# Patient Record
Sex: Male | Born: 1985 | Race: White | Hispanic: No | Marital: Single | Smoking: Current every day smoker
Health system: Southern US, Community
[De-identification: ages and names within clinical notes are randomized; demographics above are authoritative.]

## PROBLEM LIST (undated history)

## (undated) DIAGNOSIS — L03211 Cellulitis of face: Secondary | ICD-10-CM

## (undated) DIAGNOSIS — M545 Low back pain, unspecified: Secondary | ICD-10-CM

## (undated) DIAGNOSIS — F32A Depression, unspecified: Secondary | ICD-10-CM

## (undated) DIAGNOSIS — F329 Major depressive disorder, single episode, unspecified: Secondary | ICD-10-CM

## (undated) DIAGNOSIS — K047 Periapical abscess without sinus: Secondary | ICD-10-CM

## (undated) HISTORY — DX: Low back pain, unspecified: M54.50

## (undated) HISTORY — DX: Cellulitis of face: L03.211

## (undated) HISTORY — DX: Periapical abscess without sinus: K04.7

---
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
## 2009-04-12 ENCOUNTER — Inpatient Hospital Stay (HOSPITAL_COMMUNITY): Admission: EM | Admit: 2009-04-12 | Discharge: 2009-04-15 | Payer: Self-pay | Admitting: Emergency Medicine

## 2009-04-12 ENCOUNTER — Ambulatory Visit: Payer: Self-pay | Admitting: Internal Medicine

## 2009-04-15 ENCOUNTER — Ambulatory Visit: Payer: Self-pay | Admitting: Dentistry

## 2009-04-22 ENCOUNTER — Encounter: Admission: AD | Admit: 2009-04-22 | Discharge: 2009-04-22 | Payer: Self-pay | Admitting: Dentistry

## 2009-05-01 ENCOUNTER — Ambulatory Visit: Payer: Self-pay | Admitting: Internal Medicine

## 2009-05-01 DIAGNOSIS — L0201 Cutaneous abscess of face: Secondary | ICD-10-CM

## 2009-05-01 DIAGNOSIS — L03211 Cellulitis of face: Secondary | ICD-10-CM

## 2009-05-01 DIAGNOSIS — H538 Other visual disturbances: Secondary | ICD-10-CM

## 2009-05-08 ENCOUNTER — Telehealth (INDEPENDENT_AMBULATORY_CARE_PROVIDER_SITE_OTHER): Payer: Self-pay | Admitting: Internal Medicine

## 2009-05-17 ENCOUNTER — Telehealth: Payer: Self-pay | Admitting: *Deleted

## 2009-05-21 ENCOUNTER — Ambulatory Visit: Payer: Self-pay | Admitting: Infectious Disease

## 2009-05-21 DIAGNOSIS — K089 Disorder of teeth and supporting structures, unspecified: Secondary | ICD-10-CM | POA: Insufficient documentation

## 2009-05-30 ENCOUNTER — Ambulatory Visit (HOSPITAL_COMMUNITY): Admission: RE | Admit: 2009-05-30 | Discharge: 2009-05-30 | Payer: Self-pay | Admitting: Dentistry

## 2009-09-10 ENCOUNTER — Emergency Department (HOSPITAL_COMMUNITY): Admission: EM | Admit: 2009-09-10 | Discharge: 2009-09-10 | Payer: Self-pay | Admitting: Emergency Medicine

## 2010-10-13 ENCOUNTER — Encounter: Payer: Self-pay | Admitting: Internal Medicine

## 2010-10-24 LAB — BASIC METABOLIC PANEL
BUN: 4 mg/dL — ABNORMAL LOW (ref 6–23)
CO2: 27 mEq/L (ref 19–32)
Calcium: 8.9 mg/dL (ref 8.4–10.5)
Chloride: 103 mEq/L (ref 96–112)
GFR calc Af Amer: >60 mL/min (ref >60)
GFR calc non Af Amer: >60 mL/min (ref >60)
GFR calc non Af Amer: >60 mL/min (ref >60)
GFR calc non Af Amer: >60 mL/min (ref >60)
Glucose, Bld: 82 mg/dL (ref 70–99)
Glucose, Bld: 94 mg/dL (ref 70–99)
Potassium: 3.6 mEq/L (ref 3.5–5.1)
Potassium: 3.8 mEq/L (ref 3.5–5.1)
Sodium: 139 mEq/L (ref 135–145)
Sodium: 141 mEq/L (ref 135–145)

## 2010-10-24 LAB — CBC
HCT: 39 % (ref 39.0–52.0)
HCT: 42.1 % (ref 39.0–52.0)
HCT: 42.8 % (ref 39.0–52.0)
Hemoglobin: 13.2 g/dL (ref 13.0–17.0)
Hemoglobin: 13.5 g/dL (ref 13.0–17.0)
Hemoglobin: 14.6 g/dL (ref 13.0–17.0)
MCHC: 34.7 g/dL (ref 30.0–36.0)
MCV: 89.3 fL (ref 78.0–100.0)
MCV: 90 fL (ref 78.0–100.0)
Platelets: 219 10*3/uL (ref 150–400)
RBC: 4.3 MIL/uL (ref 4.22–5.81)
RBC: 4.79 MIL/uL (ref 4.22–5.81)
RDW: 13.2 % (ref 11.5–15.5)
RDW: 13.3 % (ref 11.5–15.5)
WBC: 10 10*3/uL (ref 4.0–10.5)

## 2010-10-24 LAB — POCT I-STAT, CHEM 8
Calcium, Ion: 1.16 mmol/L (ref 1.12–1.32)
Creatinine, Ser: 1 mg/dL (ref 0.4–1.5)
Glucose, Bld: 120 mg/dL — ABNORMAL HIGH (ref 70–99)
Hemoglobin: 15.6 g/dL (ref 13.0–17.0)
Potassium: 3.7 mEq/L (ref 3.5–5.1)

## 2010-10-24 LAB — DIFFERENTIAL
Eosinophils Absolute: 0 10*3/uL (ref 0.0–0.7)
Lymphs Abs: 1 10*3/uL (ref 0.7–4.0)
Monocytes Absolute: 0.7 10*3/uL (ref 0.1–1.0)
Monocytes Relative: 7 % (ref 3–12)
Neutrophils Relative %: 83 % — ABNORMAL HIGH (ref 43–77)

## 2010-10-24 LAB — CULTURE, BLOOD (ROUTINE X 2): Culture: NO GROWTH

## 2012-03-04 ENCOUNTER — Encounter (HOSPITAL_COMMUNITY): Payer: Self-pay | Admitting: Family Medicine

## 2012-03-04 ENCOUNTER — Emergency Department (HOSPITAL_COMMUNITY)
Admission: EM | Admit: 2012-03-04 | Discharge: 2012-03-04 | Disposition: A | Discharge status: Home / Self Care | Payer: Self-pay | Attending: Emergency Medicine | Admitting: Emergency Medicine

## 2012-03-04 DIAGNOSIS — R4689 Other symptoms and signs involving appearance and behavior: Secondary | ICD-10-CM

## 2012-03-04 DIAGNOSIS — F911 Conduct disorder, childhood-onset type: Secondary | ICD-10-CM | POA: Insufficient documentation

## 2012-03-04 HISTORY — DX: Major depressive disorder, single episode, unspecified: F32.9

## 2012-03-04 HISTORY — DX: Depression, unspecified: F32.A

## 2012-03-04 LAB — CBC WITH DIFFERENTIAL/PLATELET
Basophils Relative: 1 % (ref 0–1)
Eosinophils Absolute: 0.1 10*3/uL (ref 0.0–0.7)
Eosinophils Relative: 1 % (ref 0–5)
Hemoglobin: 14.5 g/dL (ref 13.0–17.0)
MCH: 30.9 pg (ref 26.0–34.0)
MCHC: 35.5 g/dL (ref 30.0–36.0)
MCV: 87 fL (ref 78.0–100.0)
Monocytes Absolute: 0.5 10*3/uL (ref 0.1–1.0)
Monocytes Relative: 5 % (ref 3–12)
Neutrophils Relative %: 82 % — ABNORMAL HIGH (ref 43–77)

## 2012-03-04 LAB — BASIC METABOLIC PANEL
BUN: 9 mg/dL (ref 6–23)
Calcium: 9.1 mg/dL (ref 8.4–10.5)
Creatinine, Ser: 1.06 mg/dL (ref 0.50–1.35)
GFR calc Af Amer: >90 mL/min (ref >90)
GFR calc non Af Amer: >90 mL/min (ref >90)
Potassium: 3.9 mEq/L (ref 3.5–5.1)

## 2012-03-04 LAB — RAPID URINE DRUG SCREEN, HOSP PERFORMED: Barbiturates: NOT DETECTED

## 2012-03-04 MED ORDER — ALUM & MAG HYDROXIDE-SIMETH 200-200-20 MG/5ML PO SUSP: 30.0000 mL | ORAL | Status: DC | PRN

## 2012-03-04 MED ORDER — ONDANSETRON HCL 4 MG PO TABS: 4.0000 mg | ORAL_TABLET | Freq: Three times a day (TID) | ORAL | Status: DC | PRN

## 2012-03-04 MED ORDER — IBUPROFEN 600 MG PO TABS: 600.0000 mg | ORAL_TABLET | Freq: Three times a day (TID) | ORAL | Status: DC | PRN

## 2012-03-04 MED ORDER — ZOLPIDEM TARTRATE 5 MG PO TABS: 5.0000 mg | ORAL_TABLET | Freq: Every evening | ORAL | Status: DC | PRN

## 2012-03-04 MED ORDER — NICOTINE 21 MG/24HR TD PT24: 21.0000 mg | MEDICATED_PATCH | Freq: Every day | TRANSDERMAL | Status: DC
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
## 2012-03-04 NOTE — ED Notes (Signed)
Pt and belongings wanded by security. 2 pt belonging bags located behind nurses station at triage.
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
## 2012-03-04 NOTE — ED Notes (Signed)
Pt unable to provide urine sample at this time 

## 2012-03-04 NOTE — ED Notes (Signed)
Security called to wand pt  

## 2012-03-04 NOTE — ED Notes (Signed)
Pt in bathroom changing into blue paper scrubs.

## 2012-03-05 NOTE — ED Provider Notes (Signed)
Medical screening examination/treatment/procedure(s) were performed by non-physician practitioner and as supervising physician I was immediately available for consultation/collaboration.   Dwon Sky, MD 03/05/12 0019 

## 2013-05-21 ENCOUNTER — Emergency Department (HOSPITAL_COMMUNITY)
Admission: EM | Admit: 2013-05-21 | Discharge: 2013-05-21 | Disposition: A | Discharge status: Home / Self Care | Payer: Self-pay | Attending: Emergency Medicine | Admitting: Emergency Medicine

## 2013-05-21 ENCOUNTER — Encounter (HOSPITAL_COMMUNITY): Payer: Self-pay | Admitting: Emergency Medicine

## 2013-05-21 DIAGNOSIS — K0889 Other specified disorders of teeth and supporting structures: Secondary | ICD-10-CM

## 2013-05-21 DIAGNOSIS — F17200 Nicotine dependence, unspecified, uncomplicated: Secondary | ICD-10-CM | POA: Insufficient documentation

## 2013-05-21 DIAGNOSIS — K089 Disorder of teeth and supporting structures, unspecified: Secondary | ICD-10-CM | POA: Insufficient documentation

## 2013-05-21 DIAGNOSIS — F329 Major depressive disorder, single episode, unspecified: Secondary | ICD-10-CM | POA: Insufficient documentation

## 2013-05-21 DIAGNOSIS — K029 Dental caries, unspecified: Secondary | ICD-10-CM | POA: Insufficient documentation

## 2013-05-21 DIAGNOSIS — Z79899 Other long term (current) drug therapy: Secondary | ICD-10-CM | POA: Insufficient documentation

## 2013-05-21 DIAGNOSIS — F3289 Other specified depressive episodes: Secondary | ICD-10-CM | POA: Insufficient documentation

## 2013-05-21 MED ORDER — PENICILLIN V POTASSIUM 500 MG PO TABS: 500.0000 mg | ORAL_TABLET | Freq: Four times a day (QID) | ORAL | Status: DC

## 2013-05-21 MED ORDER — PENICILLIN V POTASSIUM 250 MG PO TABS
500.0000 mg | ORAL_TABLET | Freq: Once | ORAL | Status: AC
  Administered 2013-05-21: 500 mg via ORAL
  Filled 2013-05-21: qty 2

## 2013-05-21 MED ORDER — HYDROCODONE-ACETAMINOPHEN 5-325 MG PO TABS: 2.0000 | ORAL_TABLET | Freq: Four times a day (QID) | ORAL | Status: DC | PRN

## 2013-05-21 NOTE — ED Provider Notes (Signed)
Medical screening examination/treatment/procedure(s) were performed by non-physician practitioner and as supervising physician I was immediately available for consultation/collaboration.   Oliviah Agostini, MD 05/21/13 0712 

## 2013-05-21 NOTE — ED Notes (Signed)
Pt states he started getting facial swelling yesterday afternoon. Pt states that a year ago he was supposed to have several teeth removed but the operation was not done because he had coffee that morning.

## 2013-05-21 NOTE — ED Provider Notes (Signed)
CSN: [id number]     Arrival date & time 05/21/13  0421 History   First MD Initiated Contact with Patient 05/21/13 [phone]     Chief Complaint  Patient presents with  . Facial Swelling   (Consider location/radiation/quality/duration/timing/severity/associated sxs/prior Treatment) HPI Comments: Patient presents to the ED with a chief complaint of dental pain/mouth swelling. Patient states the symptoms began yesterday. He states that he was supposed to have his teeth pulled approximately a year ago, but the operation was not done at that time. He reports moderate pain. He is tried taking ibuprofen from with some relief. He denies fevers, chills, nausea, or vomiting. He states that he does not have a dentist, but would like resources for one. Nothing makes his symptoms better or worse.  The history is provided by the patient. No language interpreter was used.    Past Medical History  Diagnosis Date  . Facial cellulitis     2/2 dental abscess (tooth #7)  . Dental abscess   . Depression    History reviewed. No pertinent past surgical history. History reviewed. No pertinent family history. History  Substance Use Topics  . Smoking status: Current Every Day Smoker -- 1.00 packs/day    Types: Cigarettes  . Smokeless tobacco: Never Used  . Alcohol Use: Yes     Comment: Occasional    Review of Systems  All other systems reviewed and are negative.    Allergies  Review of patient's allergies indicates no known allergies.  Home Medications   Current Outpatient Rx  Name  Route  Sig  Dispense  Refill  . Aspirin-Acetaminophen-Caffeine (GOODY HEADACHE PO)   Oral   Take 1 Package by mouth every 6 (six) hours as needed (pain).         Marland Kitchen ibuprofen (ADVIL,MOTRIN) 200 MG tablet   Oral   Take 800 mg by mouth every 6 (six) hours as needed for pain.         Marland Kitchen PRESCRIPTION MEDICATION   Oral   Take 1 tablet by mouth. This medication is a leftover antibiotic.  He took a total of 4 pills.  He  does not know the name or strength of the medication.          BP 125/74  Pulse 108  Temp(Src) 96.9 F (36.1 C) (Oral)  Resp 18  SpO2 98% Physical Exam  Nursing note and vitals reviewed. Constitutional: He is oriented to person, place, and time. He appears well-developed and well-nourished.  HENT:  Head: Normocephalic and atraumatic.  Mouth/Throat:    Poor dentition throughout.  Affected tooth as diagrammed.  No signs of peritonsillar or tonsillar abscess.  No signs of gingival abscess. Oropharynx is clear and without exudates.  Uvula is midline.  Airway is intact. No signs of Ludwig's angina with palpation of oral and sublingual mucosa.   Eyes: Conjunctivae and EOM are normal.  Neck: Normal range of motion.  Cardiovascular: Normal rate.   Pulmonary/Chest: Effort normal.  Abdominal: He exhibits no distension.  Musculoskeletal: Normal range of motion.  Neurological: He is alert and oriented to person, place, and time.  Skin: Skin is dry.  Psychiatric: He has a normal mood and affect. His behavior is normal. Judgment and thought content normal.    ED Course  Procedures (including critical care time) Labs Review Labs Reviewed - No data to display Imaging Review No results found.  EKG Interpretation   None       MDM   1. Pain, dental  Patient with dental pain and oral swelling, no signs of peritonsillar or tonsillar abscess, no signs of Ludwig angina. No trismus. Will treat with penicillin and pain medicine, recommend dental followup. Patient advised that his teeth are not going to get better without seeing a dentist. Patient understands and agrees to plan. He is stable and ready for discharge.  Filed Vitals:   05/21/13 0632  BP: 123/74  Pulse: 91  Temp: 98.9 F (37.2 C)  Resp: [address] [place] 05/21/13 [phone]

## 2014-06-09 ENCOUNTER — Emergency Department (HOSPITAL_COMMUNITY)
Admission: EM | Admit: 2014-06-09 | Discharge: 2014-06-09 | Disposition: A | Discharge status: Home / Self Care | Payer: Self-pay | Attending: Emergency Medicine | Admitting: Emergency Medicine

## 2014-06-09 ENCOUNTER — Encounter (HOSPITAL_COMMUNITY): Payer: Self-pay | Admitting: Emergency Medicine

## 2014-06-09 ENCOUNTER — Emergency Department (HOSPITAL_COMMUNITY): Payer: Self-pay

## 2014-06-09 DIAGNOSIS — Z720 Tobacco use: Secondary | ICD-10-CM | POA: Insufficient documentation

## 2014-06-09 DIAGNOSIS — Z8659 Personal history of other mental and behavioral disorders: Secondary | ICD-10-CM | POA: Insufficient documentation

## 2014-06-09 DIAGNOSIS — Z872 Personal history of diseases of the skin and subcutaneous tissue: Secondary | ICD-10-CM | POA: Insufficient documentation

## 2014-06-09 DIAGNOSIS — K047 Periapical abscess without sinus: Secondary | ICD-10-CM | POA: Insufficient documentation

## 2014-06-09 DIAGNOSIS — K029 Dental caries, unspecified: Secondary | ICD-10-CM | POA: Insufficient documentation

## 2014-06-09 LAB — CBC WITH DIFFERENTIAL/PLATELET
Basophils Absolute: 0 10*3/uL (ref 0.0–0.1)
Basophils Relative: 0 % (ref 0–1)
Eosinophils Absolute: 0 10*3/uL (ref 0.0–0.7)
Eosinophils Relative: 0 % (ref 0–5)
HEMATOCRIT: 41.7 % (ref 39.0–52.0)
HEMOGLOBIN: 14.7 g/dL (ref 13.0–17.0)
LYMPHS PCT: 11 % — AB (ref 12–46)
Lymphs Abs: 1.3 10*3/uL (ref 0.7–4.0)
MCH: 31 pg (ref 26.0–34.0)
MCHC: 35.3 g/dL (ref 30.0–36.0)
MCV: 88 fL (ref 78.0–100.0)
MONO ABS: 1.3 10*3/uL — AB (ref 0.1–1.0)
MONOS PCT: 11 % (ref 3–12)
NEUTROS ABS: 9.1 10*3/uL — AB (ref 1.7–7.7)
Neutrophils Relative %: 78 % — ABNORMAL HIGH (ref 43–77)
Platelets: 177 10*3/uL (ref 150–400)
RBC: 4.74 MIL/uL (ref 4.22–5.81)
RDW: 12.8 % (ref 11.5–15.5)
WBC: 11.7 10*3/uL — AB (ref 4.0–10.5)

## 2014-06-09 LAB — I-STAT CHEM 8, ED
BUN: 5 mg/dL — AB (ref 6–23)
CALCIUM ION: 1.06 mmol/L — AB (ref 1.12–1.23)
CHLORIDE: 102 meq/L (ref 96–112)
CREATININE: 0.9 mg/dL (ref 0.50–1.35)
GLUCOSE: 94 mg/dL (ref 70–99)
HCT: 46 % (ref 39.0–52.0)
Hemoglobin: 15.6 g/dL (ref 13.0–17.0)
Potassium: 3.8 mEq/L (ref 3.7–5.3)
Sodium: 138 mEq/L (ref 137–147)
TCO2: 29 mmol/L (ref 0–100)

## 2014-06-09 LAB — BASIC METABOLIC PANEL
ANION GAP: 13 (ref 5–15)
BUN: 6 mg/dL (ref 6–23)
CHLORIDE: 104 meq/L (ref 96–112)
CO2: 29 meq/L (ref 19–32)
CREATININE: 0.84 mg/dL (ref 0.50–1.35)
Calcium: 9.5 mg/dL (ref 8.4–10.5)
GFR calc Af Amer: >90 mL/min (ref >90)
GFR calc non Af Amer: >90 mL/min (ref >90)
Glucose, Bld: 100 mg/dL — ABNORMAL HIGH (ref 70–99)
Potassium: 4.9 mEq/L (ref 3.7–5.3)
SODIUM: 146 meq/L (ref 137–147)

## 2014-06-09 MED ORDER — HYDROMORPHONE HCL 1 MG/ML IJ SOLN
1.0000 mg | Freq: Once | INTRAMUSCULAR | Status: AC
Start: 2014-06-09 — End: 2014-06-09
  Administered 2014-06-09: 1 mg via INTRAVENOUS
  Filled 2014-06-09: qty 1

## 2014-06-09 MED ORDER — OXYCODONE-ACETAMINOPHEN 10-325 MG PO TABS: 0.5000 | ORAL_TABLET | ORAL | Status: DC | PRN

## 2014-06-09 MED ORDER — DEXTROSE 5 % IV SOLN
2.5000 10*6.[IU] | Freq: Once | INTRAVENOUS | Status: AC
  Administered 2014-06-09: 2.5 10*6.[IU] via INTRAVENOUS
  Filled 2014-06-09: qty 2.5

## 2014-06-09 MED ORDER — ONDANSETRON HCL 4 MG/2ML IJ SOLN
4.0000 mg | Freq: Once | INTRAMUSCULAR | Status: AC
  Administered 2014-06-09: 4 mg via INTRAVENOUS
  Filled 2014-06-09: qty 2

## 2014-06-09 MED ORDER — PENICILLIN V POTASSIUM 500 MG PO TABS: 500.0000 mg | ORAL_TABLET | Freq: Three times a day (TID) | ORAL | Status: DC

## 2014-06-09 MED ORDER — MORPHINE SULFATE 2 MG/ML IJ SOLN
2.0000 mg | Freq: Once | INTRAMUSCULAR | Status: AC
  Administered 2014-06-09: 2 mg via INTRAVENOUS
  Filled 2014-06-09: qty 1

## 2014-06-09 MED ORDER — IOHEXOL 300 MG/ML  SOLN
100.0000 mL | Freq: Once | INTRAMUSCULAR | Status: AC | PRN
  Administered 2014-06-09: 100 mL via INTRAVENOUS

## 2014-06-09 MED ORDER — SODIUM CHLORIDE 0.9 % IV BOLUS (SEPSIS)
1000.0000 mL | Freq: Once | INTRAVENOUS | Status: AC
  Administered 2014-06-09: 1000 mL via INTRAVENOUS

## 2014-06-09 NOTE — ED Notes (Signed)
Patient transported to CT 

## 2014-06-09 NOTE — ED Provider Notes (Signed)
CSN: [id number]     Arrival date & time 06/09/14  [id number] History   First MD Initiated Contact with Patient 06/09/14 0945     Chief Complaint  Patient presents with  . Dental Pain     (Consider location/radiation/quality/duration/timing/severity/associated sxs/prior Treatment) HPI  Patient is a 28 year old male with a complaint left-sided dental pain. Patient states that he has had about 3 days of swelling on the left side. He states that he felt a pop and some foul tasting fluid drained from the area. He states that it became more swollen and is now extending down the left side of his jaw and neck. He complains of pain with swallowing. He denies any fevers, chills, difficulty breathing. He is a current daily smoker. He has history of previous dental abscess.  Past Medical History  Diagnosis Date  . Facial cellulitis     2/2 dental abscess (tooth #7)  . Dental abscess   . Depression    History reviewed. No pertinent past surgical history. No family history on file. History  Substance Use Topics  . Smoking status: Current Every Day Smoker -- 1.00 packs/day    Types: Cigarettes  . Smokeless tobacco: Never Used  . Alcohol Use: Yes     Comment: Occasional    Review of Systems Ten systems reviewed and are negative for acute change, except as noted in the HPI.     Allergies  Review of patient's allergies indicates no known allergies.  Home Medications   Prior to Admission medications   Medication Sig Start Date End Date Taking? Authorizing Provider  ibuprofen (ADVIL,MOTRIN) 200 MG tablet Take 400 mg by mouth every 6 (six) hours as needed for moderate pain.    Yes Historical Provider, MD   BP 120/67 mmHg  Pulse 98  Temp(Src) 99.1 F (37.3 C) (Oral)  Resp 18  Ht 5\' 9"  (1.753 m)  Wt 148 lb (67.132 kg)  BMI 21.85 kg/m2  SpO2 100% Physical Exam  Constitutional: He appears well-developed and well-nourished. No distress.  HENT:  Head: Normocephalic and atraumatic.     Mouth/Throat:    Poor dentition throughout. Multiple decayed molars on the left jaw. Large amount of swelling and induration on the leftside. Tender to palpation in the left throat. Positive for trismus. Uvula appears midline. No pharyngeal edema. No sub lingual swelling  Eyes: Conjunctivae are normal. No scleral icterus.  Neck: Normal range of motion. Neck supple.  Cardiovascular: Normal rate, regular rhythm and normal heart sounds.   Pulmonary/Chest: Effort normal and breath sounds normal. No respiratory distress.  Abdominal: Soft. There is no tenderness.  Musculoskeletal: He exhibits no edema.  Neurological: He is alert.  Skin: Skin is warm and dry. He is not diaphoretic.  Psychiatric: His behavior is normal.  Nursing note and vitals reviewed.   ED Course  Procedures (including critical care time) Labs Review Labs Reviewed  BASIC METABOLIC PANEL  CBC WITH DIFFERENTIAL  I-STAT CHEM 8, ED    Imaging Review No results found.   EKG Interpretation None      MDM   Final diagnoses:  None    Patient here with left-sided facial swelling and dental infection. He complains of pain and is tender to palpation in the left throat. If ordered labs, IV fluids, IV penicillin, pain medication, and CT of the face. 2. Rule out Ludwig's angina. No stridor. His mildly elevated oral temperature.    Patient with dental abscess. No visualized extension of the abscess into the pharyngeal space.  Patient will be discharge with pain meds and abx. F/u with dentisit  Arthor CaptainAbigail Waylen Depaolo, PA-C 06/17/14 1616  Ethelda ChickMartha K Linker, MD 06/18/14 [phone]

## 2014-06-09 NOTE — Discharge Instructions (Signed)

## 2014-06-09 NOTE — ED Notes (Signed)
Pt from home c/o left sided dental pain and swelling (lower back). See here beginning on Nov for same and was given antibiotics and pain medication but did not follow up with dentist as advised.

## 2015-02-18 ENCOUNTER — Emergency Department (HOSPITAL_COMMUNITY)
Admission: EM | Admit: 2015-02-18 | Discharge: 2015-02-18 | Disposition: A | Discharge status: Home / Self Care | Payer: Self-pay | Attending: Emergency Medicine | Admitting: Emergency Medicine

## 2015-02-18 ENCOUNTER — Encounter (HOSPITAL_COMMUNITY): Payer: Self-pay | Admitting: *Deleted

## 2015-02-18 DIAGNOSIS — K0889 Other specified disorders of teeth and supporting structures: Secondary | ICD-10-CM

## 2015-02-18 DIAGNOSIS — Z720 Tobacco use: Secondary | ICD-10-CM | POA: Insufficient documentation

## 2015-02-18 DIAGNOSIS — K088 Other specified disorders of teeth and supporting structures: Secondary | ICD-10-CM | POA: Insufficient documentation

## 2015-02-18 DIAGNOSIS — Z8659 Personal history of other mental and behavioral disorders: Secondary | ICD-10-CM | POA: Insufficient documentation

## 2015-02-18 DIAGNOSIS — K029 Dental caries, unspecified: Secondary | ICD-10-CM | POA: Insufficient documentation

## 2015-02-18 MED ORDER — HYDROCODONE-ACETAMINOPHEN 5-325 MG PO TABS: 2.0000 | ORAL_TABLET | ORAL | Status: DC | PRN

## 2015-02-18 MED ORDER — CLINDAMYCIN HCL 150 MG PO CAPS: 150.0000 mg | ORAL_CAPSULE | Freq: Four times a day (QID) | ORAL | Status: DC

## 2015-02-18 NOTE — ED Notes (Signed)
The pt is c/o  A toothache with lt jaw swelling since yesterday

## 2015-02-18 NOTE — Discharge Instructions (Signed)
Dental Pain °A tooth ache may be caused by cavities (tooth decay). Cavities expose the nerve of the tooth to air and hot or cold temperatures. It may come from an infection or abscess (also called a boil or furuncle) around your tooth. It is also often caused by dental caries (tooth decay). This causes the pain you are having. °DIAGNOSIS  °Your caregiver can diagnose this problem by exam. °TREATMENT  °· If caused by an infection, it may be treated with medications which kill germs (antibiotics) and pain medications as prescribed by your caregiver. Take medications as directed. °· Only take over-the-counter or prescription medicines for pain, discomfort, or fever as directed by your caregiver. °· Whether the tooth ache today is caused by infection or dental disease, you should see your dentist as soon as possible for further care. °SEEK MEDICAL CARE IF: °The exam and treatment you received today has been provided on an emergency basis only. This is not a substitute for complete medical or dental care. If your problem worsens or new problems (symptoms) appear, and you are unable to meet with your dentist, call or return to this location. °SEEK IMMEDIATE MEDICAL CARE IF:  °· You have a fever. °· You develop redness and swelling of your face, jaw, or neck. °· You are unable to open your mouth. °· You have severe pain uncontrolled by pain medicine. °MAKE SURE YOU:  °· Understand these instructions. °· Will watch your condition. °· Will get help right away if you are not doing well or get worse. °Document Released: 07/06/2005 Document Revised: 09/28/2011 Document Reviewed: 02/22/2008 °ExitCare® Patient Information ©2015 [employer]. This information is not intended to replace advice given to you by your health care provider. Make sure you discuss any questions you have with your health care provider. ° °Emergency Department Resource Guide °1) Find a Doctor and Pay Out of Pocket °Although you won't have to find out who  is covered by your insurance plan, it is a good idea to ask around and get recommendations. You will then need to call the office and see if the doctor you have chosen will accept you as a new patient and what types of options they offer for patients who are self-pay. Some doctors offer discounts or will set up payment plans for their patients who do not have insurance, but you will need to ask so you aren't surprised when you get to your appointment. ° °2) Contact Your Local Health Department °Not all health departments have doctors that can see patients for sick visits, but many do, so it is worth a call to see if yours does. If you don't know where your local health department is, you can check in your phone book. The CDC also has a tool to help you locate your state's health department, and many state websites also have listings of all of their local health departments. ° °3) Find a Walk-in Clinic °If your illness is not likely to be very severe or complicated, you may want to try a walk in clinic. These are popping up all over the country in pharmacies, drugstores, and shopping centers. They're usually staffed by nurse practitioners or physician assistants that have been trained to treat common illnesses and complaints. They're usually fairly quick and inexpensive. However, if you have serious medical issues or chronic medical problems, these are probably not your best option. ° °No Primary Care Doctor: °- Call Health Connect at  832-8000 - they can help you locate a primary   care doctor that  accepts your insurance, provides certain services, etc. °- Physician Referral Service- 1-800-533-3463 ° °Chronic Pain Problems: °Organization         Address  Phone   Notes  °[facility]  ([phone]) 297-2271 Patients need to be referred by their primary care doctor.  ° °Medication Assistance: °Organization         Address  Phone   Notes  °[place] [facility] [address]  [address] °[place], [place] [address] (336) 641-8030 --Must be a resident of [place] °-- Must have NO insurance coverage whatsoever (no Medicaid/ Medicare, etc.) °-- The pt. MUST have a primary care doctor that directs their care regularly and follows them in the community °  °[facility]  (866) 331-1348   °[employer]  (888) 892-1162   ° °Agencies that provide inexpensive medical care: °Organization         Address  Phone   Notes  °[facility]  (336) 832-8035   °[facility]    (336) 832-7272   °[facility] [address] °[place], [place] [address] (336) 832-4777   °[facility] [address], °[place] (336) 271-4999   °[facility]    (336) 373-0678   °[facility]    (336) 272-1050   °[facility] ° [address], [place] Phone:  (336) 832-4444, Fax:  (336) 832-4440 Hours of Operation:  9 am - 6 pm, M-F.  Also accepts Medicaid/Medicare and self-pay.  °[facility] ° [address], [place] Phone: (336) 832-3150, Fax: (336) 832-3151. Hours of Operation:  8:30 am - 5:30 pm, M-F.  Also accepts Medicaid and self-pay.  °[facility] [place] [address], [place] Phone: (336) 878-6027   °[facility] [address], [place], [place] (336)723-1848, Ext. 123 Mondays & Thursdays: 7-9 AM.  First 15 patients are seen on a first come, first serve basis. °  ° °Medicaid-accepting [place] Providers: ° °Organization         Address  Phone   Notes  °[facility] [address], [place] (336) 641-2100 Also accepts self-pay patients.  °[facility] [address], [place] ° (336) 856-9996   °[facility] [address], [place] (336) 288-8857   °[facility] [address] [place] [address], [place] (336) 299-7000   °[facility] [address], [place]  ° (336) 373-1557 Only accepts [place] Access Medicaid patients after they have their name applied to their card.  ° °Self-Pay (no insurance) in [place]: ° °Organization         Address  Phone   Notes  °Sickle Cell Patients, [facility] [address], [place] (336) 832-1970   °[facility] [address], [place] (336) 832-4400   ° [facility] ° [address], [place] (336) 992-4800   °[facility]/Dr. Osei-Bonsu ° [address] [place] [address], [place] or [address], [place] (336) [phone] Phone number for both [place] and [place] locations is the same.  °[facility] [address], [place] (336) 299-0000   °[facility] [address] [place] [address], [place] or [address] (336) 852-7530 °(336) 878-2260   °  [facility] [address], [place] (336) 350-1642, phone; (336) 294-5005, fax Sees patients 1st and 3rd Saturday of every month.  Must not qualify for public or private insurance (i.e. Medicaid, Medicare, [place] Health Choice, Veterans' Benefits) • Household income should be no more than 200% of the poverty level •The clinic cannot treat you if you are pregnant or think you are pregnant • Sexually transmitted diseases are not treated at the clinic.  ° ° °Dental Care: °Organization         Address  Phone  Notes  °[place] [facility] [address], [place] (336) 641-6152 Accepts children up to age 21 who are enrolled in Medicaid or [place] Health Choice; pregnant women with a Medicaid card; and children who have applied for Medicaid or [place] Health Choice, but were declined, whose parents can pay a reduced fee at time of service.  °[facility] [place]  [address], [place] (336) 641-7733 Accepts children up to age 21 who are  enrolled in Medicaid or [place] Health Choice; pregnant women with a Medicaid card; and children who have applied for Medicaid or [place] Health Choice, but were declined, whose parents can pay a reduced fee at time of service.  °[facility] ° [address], [place] (336) 641-4533 Patients are seen by appointment only. Walk-ins are not accepted. [facility] will see patients 18 years of age and older. °Monday - Tuesday (8am-5pm) °Most Wednesdays (8:30-5pm) °$30 per visit, cash only  °[facility] ° [address], [place] (336) 641-4533 Patients are seen by appointment only. Walk-ins are not accepted. [facility] will see patients 18 years of age and older. °One Wednesday Evening (Monthly: Volunteer Based).  $30 per visit, cash only  °[facility]  (919) 537-3737 for adults; Children under age 4, call Graduate Pediatric Dentistry at (919) [phone]. Children aged 4-14, please call ([phone]) [phone] to request a pediatric application. ° Dental services are provided in all areas of dental care including fillings, crowns and bridges, complete and partial dentures, implants, gum treatment, root canals, and extractions. Preventive care is also provided. Treatment is provided to both adults and children. °Patients are selected via a lottery and there is often a waiting list. °  °[facility] [address], ° ° (336) 763-8833 [email or web address].[email or web address] °  °[facility] [address], [place], [place] (336)723-1848, Ext. 123 Second and Fourth Thursday of each month, opens at 6:30 AM; Clinic ends at 9 AM.  Patients are seen on a first-come first-served basis, and a limited number are seen during each clinic.  ° °[facility] ° [address], [place], [place] (336) 723-7904   Eligibility Requirements °You must have lived in [place], [place], or [place] counties for at least the last three months. °  You  cannot be eligible for state or federal sponsored healthcare insurance, including Veterans Administration, Medicaid, or Medicare. °  You generally cannot be eligible for healthcare insurance through your employer.  °  How to apply: °Eligibility screenings are held every Tuesday and Wednesday afternoon from 1:00 pm until 4:00 pm. You do not need an appointment for the interview!  °[facility] [address], [place],  336-631-2330   °[facility]  336-342-8273   °[facility]  336-703-3100   °[facility]   [place] in the Community: Intensive Outpatient Programs Organization         Address  Phone  Notes  [facility] [place] [address] [place], [place] (934) 758-5776   [facility] [address], [place], [place]   [facility] [address] [place], [place]   [place] [address]  [place], [place] or [phone]   Substance Abuse Resources Organization         Address  Phone  Notes  [facility]  724-828-4836   [place]  250-131-6260   [facility]   Chinita Pester  [phone]   Residential & Outpatient Substance Abuse Program  (801)402-8013   Psychological Services Organization         Address  Phone  Notes  [facility] [place]  [place]  (941)746-9411   [place] [address] [place] or 339-836-4699    Mobile Crisis Teams Organization         Address  Phone  Notes  [facility], Mobile Crisis Care Unit  626-147-2728   [facility]  [address] [place], [place]   [place] [address] [place] (412) 818-8695    Self-Help/Support  Groups Organization         Address  Phone             Notes  [facility] [place] - variety of support groups  [place] Call for more information  Narcotics Anonymous (NA), [facility] [address], [employer] [place]  2 meetings at this location   Special educational needs teacher         Address  Phone  Notes  [facility] [place],    [place]  1-628-731-6347   [facility]  [address] [place] [address], [place], [place]   [place], [place] (704)[phone] Admissions: 8am-3pm M-F  Incent[facility] Substance [place] [address]    [place], [place] [phone]   [facility] [address] [place], [place], [place]   [facility] [address]  [place], [place]   [facility] - Intensive Outpatient [address], [place], [place]   [facility] (House.) [place]  [place], [place] 1-956-035-2015 or (929)697-8638   [facility] ([facility]) [address], [place], [place] Accepts Medicaid  [facility] [address],  [place] 1-(209)636-8495 Substance Abuse/Addiction Treatment   [facility]         Address  Phone  Notes  [facility]  641-236-8678   Domenic Schwab, PhD [address] [place], [place]   (475)585-4080 or (412) [phone]   [place], [place] [phone]   [place] [address] [place], [place] [phone] Insurance/Medicaid/sponsorship through [employer] and Families [address], [place]                                    [place], [place] [phone] Therapy/tele-psych/case  [facility] [address]   [place], [place] [phone]    Dr. Lolly Mustache  [phone]   [facility]  [employer] [facility] Dept. 1) [address] [place] 2) [address] [place] 3)  [address], [place] ([phone] [phone]  [phone]   [facility] Child Abuse Hotline 604-255-2712 or (361)[phone] (After Hours)     Please use medication as directed, monitor for new or worsening signs or symptoms. Please follow-up with dentist immediately for further evaluation and management. Please return to the emergency room if new or worsening signs or symptoms present.

## 2015-02-18 NOTE — ED Provider Notes (Signed)
CSN: [id number]     Arrival date & time 02/18/15  0607 History   First MD Initiated Contact with Patient 02/18/15 [phone]     Chief Complaint  Patient presents with  . Dental Problem   HPI   29 year old male presents dental pain and left-sided jaw swelling. Patient reports a significant past medical history of similar presentations, reports that he does not have a dentist is usually seen in the emergency room with antibiotics pain medication. He reports that he is never followed up with a dentist for this as it has improved. Patient reports symptoms started 1 day ago with the dental pain and swelling, patient denies fever, chills, nausea, vomiting, swelling of the throat neck. Patient reports he is able to open and close his jaw, has difficulty with chewing but is able swallow without difficulty. Patient has not tried any over-the-counter medications.   Past Medical History  Diagnosis Date  . Facial cellulitis     2/2 dental abscess (tooth #7)  . Dental abscess   . Depression    History reviewed. No pertinent past surgical history. No family history on file. History  Substance Use Topics  . Smoking status: Current Every Day Smoker -- 1.00 packs/day    Types: Cigarettes  . Smokeless tobacco: Never Used  . Alcohol Use: Yes     Comment: Occasional    Review of Systems  All other systems reviewed and are negative.   Allergies  Review of patient's allergies indicates no known allergies.  Home Medications   Prior to Admission medications   Medication Sig Start Date End Date Taking? Authorizing Provider  clindamycin (CLEOCIN) 150 MG capsule Take 1 capsule (150 mg total) by mouth every 6 (six) hours. 02/18/15   Eyvonne Mechanic, PA-C  HYDROcodone-acetaminophen (NORCO/VICODIN) 5-325 MG per tablet Take 2 tablets by mouth every 4 (four) hours as needed. 02/18/15   Eyvonne Mechanic, PA-C  ibuprofen (ADVIL,MOTRIN) 200 MG tablet Take 400 mg by mouth every 6 (six) hours as needed for moderate pain.      Historical Provider, MD  oxyCODONE-acetaminophen (PERCOCET) 10-325 MG per tablet Take 0.5-1 tablets by mouth every 4 (four) hours as needed for pain. Patient not taking: Reported on 02/18/2015 06/09/14   Arthor Captain, PA-C  penicillin v potassium (VEETID) 500 MG tablet Take 1 tablet (500 mg total) by mouth 3 (three) times daily. Patient not taking: Reported on 02/18/2015 06/09/14   Arthor Captain, PA-C   BP 110/66 mmHg  Pulse 72  Temp(Src) 98.2 F (36.8 C) (Oral)  Resp 12  Ht 5' 9.5" (1.765 m)  SpO2 97%   Physical Exam  Constitutional: He is oriented to person, place, and time. He appears well-developed and well-nourished.  HENT:  Head: Normocephalic and atraumatic.  Face asymmetrical with minor swelling to the left mandible about the second molar. No redness noted to the area of swelling. Patient tender to palpation.   Neck supple with full range of motion nontender to palpation  Patient has multiple missing teeth, dental caries. Second molar with surrounding swelling to the gumline noted no palpable abscess noted. Floor the mouth soft nontender, tongue normal with full range of motion, posterior oropharynx without swelling or edema, remainder of jawline normal.  Eyes: Conjunctivae are normal. Pupils are equal, round, and reactive to light. Right eye exhibits no discharge. Left eye exhibits no discharge. No scleral icterus.  Neck: Normal range of motion. No JVD present. No tracheal deviation present.  Pulmonary/Chest: Effort normal. No stridor.  Neurological: He  is alert and oriented to person, place, and time. Coordination normal.  Psychiatric: He has a normal mood and affect. His behavior is normal. Judgment and thought content normal.  Nursing note and vitals reviewed.   ED Course  Procedures (including critical care time) Labs Review Labs Reviewed - No data to display  Imaging Review No results found.   EKG Interpretation None      MDM   Final diagnoses:  Pain,  dental    Labs:  Imaging:  Consults:  Therapeutics:  Discharge Meds: Clindamycin, Vicodin  Assessment/Plan: Patient presents with likely dental infection. No appreciable abscess on exam that would be amenable to I&D. Symptoms began yesterday, this is likely early in the infectious process, he was prescribed clindamycin, oral pain medication, encouraged to follow-up with dentist for further evaluation and management. He was given dental resources during the ED visit. Neck was supple full range of motion, unlikely deep space infection. Patient was afebrile, vital signs are reassuring. Patient understood and agreed to today's plan and had no further questions or concerns at the time of discharge.         Eyvonne Mechanic, PA-C 02/19/15 1610  Doug Sou, MD 02/19/15 [phone]

## 2015-07-01 ENCOUNTER — Emergency Department (HOSPITAL_COMMUNITY)
Admission: EM | Admit: 2015-07-01 | Discharge: 2015-07-01 | Disposition: A | Discharge status: Home / Self Care | Payer: Self-pay | Attending: Emergency Medicine | Admitting: Emergency Medicine

## 2015-07-01 ENCOUNTER — Encounter (HOSPITAL_COMMUNITY): Payer: Self-pay | Admitting: Emergency Medicine

## 2015-07-01 ENCOUNTER — Emergency Department (HOSPITAL_COMMUNITY): Payer: Self-pay

## 2015-07-01 DIAGNOSIS — F17210 Nicotine dependence, cigarettes, uncomplicated: Secondary | ICD-10-CM | POA: Insufficient documentation

## 2015-07-01 DIAGNOSIS — Z792 Long term (current) use of antibiotics: Secondary | ICD-10-CM | POA: Insufficient documentation

## 2015-07-01 DIAGNOSIS — Z872 Personal history of diseases of the skin and subcutaneous tissue: Secondary | ICD-10-CM | POA: Insufficient documentation

## 2015-07-01 DIAGNOSIS — Z8719 Personal history of other diseases of the digestive system: Secondary | ICD-10-CM | POA: Insufficient documentation

## 2015-07-01 DIAGNOSIS — Z8659 Personal history of other mental and behavioral disorders: Secondary | ICD-10-CM | POA: Insufficient documentation

## 2015-07-01 DIAGNOSIS — M545 Low back pain: Secondary | ICD-10-CM

## 2015-07-01 MED ORDER — NAPROXEN 375 MG PO TABS: 375.0000 mg | ORAL_TABLET | Freq: Two times a day (BID) | ORAL | Status: DC

## 2015-07-01 MED ORDER — CYCLOBENZAPRINE HCL 10 MG PO TABS: 10.0000 mg | ORAL_TABLET | Freq: Two times a day (BID) | ORAL | Status: DC | PRN

## 2015-07-01 MED ORDER — HYDROCODONE-ACETAMINOPHEN 5-325 MG PO TABS
1.0000 | ORAL_TABLET | Freq: Once | ORAL | Status: AC
  Administered 2015-07-01: 1 via ORAL
  Filled 2015-07-01: qty 1

## 2015-07-01 NOTE — ED Notes (Signed)
Pt report lower back pain radiating to left leg x 2 weeks, denies fall nor injury. denies Hx sciatica.. Pt ambulated to room with some difficulty.

## 2015-07-01 NOTE — ED Provider Notes (Signed)
CSN: [id number]     Arrival date & time 07/01/15  1502 History  By signing my name below, I, Jarvis Morganaylor Ferguson, attest that this documentation has been prepared under the direction and in the presence of Dub MikesSamantha Tripp Sladen Plancarte, PA-C Electronically Signed: Jarvis Morganaylor Ferguson, ED Scribe. 07/01/2015. 3:51 PM.     Chief Complaint  Patient presents with  . Back Pain    lower   The history is provided by the patient. No language interpreter was used.    HPI Comments: Renford DillsShawn Domine is a 29 y.o. male who presents to the Emergency Department complaining of sudden onset, intermittent, moderate, back pain that began 2 weeks ago. He states the pain radiates down into his left leg. Pt notes the pain began when he was walking. He denies any h/o sciatica or back problems. Pt endorses the pain is exacerbated with walking or standing for long periods of time. He denies any alleviating factors.  Pt denies any known fall, injury or heavy lifting prior to onset of the pain. He denies any numbness, weakness, fevers, urinary/bowel incontinence, saddle anesthesia or other associated symptoms.    Past Medical History  Diagnosis Date  . Facial cellulitis     2/2 dental abscess (tooth #7)  . Dental abscess   . Depression    History reviewed. No pertinent past surgical history. No family history on file. Social History  Substance Use Topics  . Smoking status: Current Every Day Smoker -- 1.00 packs/day    Types: Cigarettes  . Smokeless tobacco: Never Used  . Alcohol Use: Yes     Comment: Occasional    Review of Systems  All other systems reviewed and are negative.     Allergies  Review of patient's allergies indicates no known allergies.  Home Medications   Prior to Admission medications   Medication Sig Start Date End Date Taking? Authorizing Provider  clindamycin (CLEOCIN) 150 MG capsule Take 1 capsule (150 mg total) by mouth every 6 (six) hours. 02/18/15   Eyvonne MechanicJeffrey Hedges, PA-C   HYDROcodone-acetaminophen (NORCO/VICODIN) 5-325 MG per tablet Take 2 tablets by mouth every 4 (four) hours as needed. 02/18/15   Eyvonne MechanicJeffrey Hedges, PA-C  ibuprofen (ADVIL,MOTRIN) 200 MG tablet Take 400 mg by mouth every 6 (six) hours as needed for moderate pain.     Historical Provider, MD  oxyCODONE-acetaminophen (PERCOCET) 10-325 MG per tablet Take 0.5-1 tablets by mouth every 4 (four) hours as needed for pain. Patient not taking: Reported on 02/18/2015 06/09/14   Arthor CaptainAbigail Harris, PA-C  penicillin v potassium (VEETID) 500 MG tablet Take 1 tablet (500 mg total) by mouth 3 (three) times daily. Patient not taking: Reported on 02/18/2015 06/09/14   Arthor CaptainAbigail Harris, PA-C   Triage Vitals: BP 123/74 mmHg  Pulse 87  Temp(Src) 97.6 F (36.4 C) (Oral)  Resp 16  SpO2 98%  Physical Exam  Constitutional: He is oriented to person, place, and time. He appears well-developed and well-nourished. No distress.  HENT:  Head: Normocephalic and atraumatic.  Eyes: Conjunctivae are normal. Right eye exhibits no discharge. Left eye exhibits no discharge. No scleral icterus.  Neck: Neck supple.  Cardiovascular: Normal rate.   Pulmonary/Chest: Effort normal.  Musculoskeletal:  TTP of R lumbar para spinal muscles. No midline spinal tenderness.   Lymphadenopathy:    He has no cervical adenopathy.  Neurological: He is alert and oriented to person, place, and time. No cranial nerve deficit. Coordination normal.  Strength 5/5 throughout. No sensory deficits.  No gait abnormality.  Skin: Skin is warm and dry. No rash noted. He is not diaphoretic. No erythema. No pallor.  Psychiatric: He has a normal mood and affect. His behavior is normal.  Nursing note and vitals reviewed.   ED Course  Procedures (including critical care time)  DIAGNOSTIC STUDIES: Oxygen Saturation is 98% on RA, normal by my interpretation.    COORDINATION OF CARE: 3:54 PM- Will order imaging of lumbar spine. Pt advised of plan for treatment and  pt agrees..    Labs Review Labs Reviewed - No data to display  Imaging Review Dg Lumbar Spine Complete  07/01/2015  CLINICAL DATA:  Pt report lower back pain radiating to left leg x 2 weeks. Pt states no injury. EXAM: LUMBAR SPINE - COMPLETE 4+ VIEW COMPARISON:  None. FINDINGS: Five lumbar type vertebral bodies. Minimal convex left lumbar spine curvature. Sacroiliac joints are symmetric. Maintenance of vertebral body height and alignment. Intervertebral disc heights are maintained. IMPRESSION: No acute osseous abnormality. Minimal convex left lumbar spine curvature. Electronically Signed   By: Jeronimo Greaves M.D.   On: 07/01/2015 16:25   I have personally reviewed and evaluated these images as part of my medical decision-making.   EKG Interpretation None      MDM   Final diagnoses:  Low back pain without sciatica, unspecified back pain laterality    Patient with back pain.  No neurological deficits and normal neuro exam.  Patient can walk but states is painful.  No loss of bowel or bladder control.  No concern for cauda equina.  No fever, night sweats, weight loss, h/o cancer, IVDU.  RICE protocol and pain medicine indicated and discussed with patient. Pt given ortho follow up if symptoms do not improve. Pt hemodynamically stable and ready for discharge.    I personally performed the services described in this documentation, which was scribed in my presence. The recorded information has been reviewed and is accurate.      Lester Kinsman [place], PA-C 07/02/15 1909  Lorre Nick, MD 07/04/15 [phone]

## 2015-07-01 NOTE — Discharge Instructions (Signed)
Back Pain, Adult Back pain is very common in adults.The cause of back pain is rarely dangerous and the pain often gets better over time.The cause of your back pain may not be known. Some common causes of back pain include: 1. Strain of the muscles or ligaments supporting the spine. 2. Wear and tear (degeneration) of the spinal disks. 3. Arthritis. 4. Direct injury to the back. For many people, back pain may return. Since back pain is rarely dangerous, most people can learn to manage this condition on their own. HOME CARE INSTRUCTIONS Watch your back pain for any changes. The following actions may help to lessen any discomfort you are feeling: 1. Remain active. It is stressful on your back to sit or stand in one place for long periods of time. Do not sit, drive, or stand in one place for more than 30 minutes at a time. Take short walks on even surfaces as soon as you are able.Try to increase the length of time you walk each day. 2. Exercise regularly as directed by your health care provider. Exercise helps your back heal faster. It also helps avoid future injury by keeping your muscles strong and flexible. 3. Do not stay in bed.Resting more than 1-2 days can delay your recovery. 4. Pay attention to your body when you bend and lift. The most comfortable positions are those that put less stress on your recovering back. Always use proper lifting techniques, including: 1. Bending your knees. 2. Keeping the load close to your body. 3. Avoiding twisting. 5. Find a comfortable position to sleep. Use a firm mattress and lie on your side with your knees slightly bent. If you lie on your back, put a pillow under your knees. 6. Avoid feeling anxious or stressed.Stress increases muscle tension and can worsen back pain.It is important to recognize when you are anxious or stressed and learn ways to manage it, such as with exercise. 7. Take medicines only as directed by your health care provider.  Over-the-counter medicines to reduce pain and inflammation are often the most helpful.Your health care provider may prescribe muscle relaxant drugs.These medicines help dull your pain so you can more quickly return to your normal activities and healthy exercise. 8. Apply ice to the injured area: 1. Put ice in a plastic bag. 2. Place a towel between your skin and the bag. 3. Leave the ice on for 20 minutes, 2-3 times a day for the first 2-3 days. After that, ice and heat may be alternated to reduce pain and spasms. 9. Maintain a healthy weight. Excess weight puts extra stress on your back and makes it difficult to maintain good posture. SEEK MEDICAL CARE IF: 1. You have pain that is not relieved with rest or medicine. 2. You have increasing pain going down into the legs or buttocks. 3. You have pain that does not improve in one week. 4. You have night pain. 5. You lose weight. 6. You have a fever or chills. SEEK IMMEDIATE MEDICAL CARE IF:  1. You develop new bowel or bladder control problems. 2. You have unusual weakness or numbness in your arms or legs. 3. You develop nausea or vomiting. 4. You develop abdominal pain. 5. You feel faint.   This information is not intended to replace advice given to you by your health care provider. Make sure you discuss any questions you have with your health care provider.   Document Released: 07/06/2005 Document Revised: 07/27/2014 Document Reviewed: 11/07/2013 Elsevier Interactive Patient Education 2016 [employer]  Back Injury Prevention Back injuries can be very painful. They can also be difficult to heal. After having one back injury, you are more likely to injure your back again. It is important to learn how to avoid injuring or re-injuring your back. The following tips can help you to prevent a back injury. WHAT SHOULD I KNOW ABOUT PHYSICAL FITNESS? 5. Exercise for 30 minutes per day on most days of the week or as directed by your health care  provider. Make sure to: 1. Do aerobic exercises, such as walking, jogging, biking, or swimming. 2. Do exercises that increase balance and strength, such as tai chi and yoga. These can decrease your risk of falling and injuring your back. 3. Do stretching exercises to help with flexibility. 4. Try to develop strong abdominal muscles. Your abdominal muscles provide a lot of the support that is needed by your back. 6. Maintain a healthy weight. This helps to decrease your risk of a back injury. WHAT SHOULD I KNOW ABOUT MY DIET? 10. Talk with your health care provider about your overall diet. Take supplements and vitamins only as directed by your health care provider. 11. Talk with your health care provider about how much calcium and vitamin D you need each day. These nutrients help to prevent weakening of the bones (osteoporosis). Osteoporosis can cause broken (fractured) bones, which lead to back pain. 12. Include good sources of calcium in your diet, such as dairy products, green leafy vegetables, and products that have had calcium added to them (fortified). 13. Include good sources of vitamin D in your diet, such as milk and foods that are fortified with vitamin D. WHAT SHOULD I KNOW ABOUT MY POSTURE? 7. Sit up straight and stand up straight. Avoid leaning forward when you sit or hunching over when you stand. 8. Choose chairs that have good low-back (lumbar) support. 9. If you work at a desk, sit close to it so you do not need to lean over. Keep your chin tucked in. Keep your neck drawn back, and keep your elbows bent at a right angle. Your arms should look like the letter "L." 10. Sit high and close to the steering wheel when you drive. Add a lumbar support to your car seat, if needed. 11. Avoid sitting or standing in one position for very long. Take breaks to get up, stretch, and walk around at least one time every hour. Take breaks every hour if you are driving for long periods of  time. 12. Sleep on your side with your knees slightly bent, or sleep on your back with a pillow under your knees. Do not lie on the front of your body to sleep. WHAT SHOULD I KNOW ABOUT LIFTING, TWISTING, AND REACHING? Lifting and Heavy Lifting 6. Avoid heavy lifting, especially repetitive heavy lifting. If you must do heavy lifting: 1. Stretch before lifting. 2. Work slowly. 3. Rest between lifts. 4. Use a tool such as a cart or a dolly to move objects if one is available. 5. Make several small trips instead of carrying one heavy load. 6. Ask for help when you need it, especially when moving big objects. 7. Follow these steps when lifting: 1. Stand with your feet shoulder-width apart. 2. Get as close to the object as you can. Do not try to pick up a heavy object that is far from your body. 3. Use handles or lifting straps if they are available. 4. Bend at your knees. Squat down, but keep your heels  off the floor. 5. Keep your shoulders pulled back, your chin tucked in, and your back straight. 6. Lift the object slowly while you tighten the muscles in your legs, abdomen, and buttocks. Keep the object as close to the center of your body as possible. 8. Follow these steps when putting down a heavy load: 1. Stand with your feet shoulder-width apart. 2. Lower the object slowly while you tighten the muscles in your legs, abdomen, and buttocks. Keep the object as close to the center of your body as possible. 3. Keep your shoulders pulled back, your chin tucked in, and your back straight. 4. Bend at your knees. Squat down, but keep your heels off the floor. 5. Use handles or lifting straps if they are available. Twisting and Reaching 1. Avoid lifting heavy objects above your waist. 2. Do not twist at your waist while you are lifting or carrying a load. If you need to turn, move your feet. 3. Do not bend over without bending at your knees. 4. Avoid reaching over your head, across a table, or for  an object on a high surface. WHAT ARE SOME OTHER TIPS? 1. Avoid wet floors and icy ground. Keep sidewalks clear of ice to prevent falls. 2. Do not sleep on a mattress that is too soft or too hard. 3. Keep items that are used frequently within easy reach. 4. Put heavier objects on shelves at waist level, and put lighter objects on lower or higher shelves. 5. Find ways to decrease your stress, such as exercise, massage, or relaxation techniques. Stress can build up in your muscles. Tense muscles are more vulnerable to injury. 6. Talk with your health care provider if you feel anxious or depressed. These conditions can make back pain worse. 7. Wear flat heel shoes with cushioned soles. 8. Avoid sudden movements. 9. Use both shoulder straps when carrying a backpack. 10. Do not use any tobacco products, including cigarettes, chewing tobacco, or electronic cigarettes. If you need help quitting, ask your health care provider.   This information is not intended to replace advice given to you by your health care provider. Make sure you discuss any questions you have with your health care provider.   Document Released: 08/13/2004 Document Revised: 11/20/2014 Document Reviewed: 07/10/2014 Elsevier Interactive Patient Education 2016 [employer]  Back Exercises The following exercises strengthen the muscles that help to support the back. They also help to keep the lower back flexible. Doing these exercises can help to prevent back pain or lessen existing pain. If you have back pain or discomfort, try doing these exercises 2-3 times each day or as told by your health care provider. When the pain goes away, do them once each day, but increase the number of times that you repeat the steps for each exercise (do more repetitions). If you do not have back pain or discomfort, do these exercises once each day or as told by your health care provider. EXERCISES Single Knee to Chest Repeat these steps 3-5 times  for each leg: 7. Lie on your back on a firm bed or the floor with your legs extended. 8. Bring one knee to your chest. Your other leg should stay extended and in contact with the floor. 9. Hold your knee in place by grabbing your knee or thigh. 10. Pull on your knee until you feel a gentle stretch in your lower back. 11. Hold the stretch for 10-30 seconds. 12. Slowly release and straighten your leg. Pelvic Tilt Repeat these  steps 5-10 times: 14. Lie on your back on a firm bed or the floor with your legs extended. Foreman your knees so they are pointing toward the ceiling and your feet are flat on the floor. 26. Tighten your lower abdominal muscles to press your lower back against the floor. This motion will tilt your pelvis so your tailbone points up toward the ceiling instead of pointing to your feet or the floor. 17. With gentle tension and even breathing, hold this position for 5-10 seconds. Cat-Cow Repeat these steps until your lower back becomes more flexible: 13. Get into a hands-and-knees position on a firm surface. Keep your hands under your shoulders, and keep your knees under your hips. You may place padding under your knees for comfort. 14. Let your head hang down, and point your tailbone toward the floor so your lower back becomes rounded like the back of a cat. 15. Hold this position for 5 seconds. 16. Slowly lift your head and point your tailbone up toward the ceiling so your back forms a sagging arch like the back of a cow. 17. Hold this position for 5 seconds. Press-Ups Repeat these steps 5-10 times: 9. Lie on your abdomen (face-down) on the floor. 10. Place your palms near your head, about shoulder-width apart. 11. While you keep your back as relaxed as possible and keep your hips on the floor, slowly straighten your arms to raise the top half of your body and lift your shoulders. Do not use your back muscles to raise your upper torso. You may adjust the placement of your  hands to make yourself more comfortable. 12. Hold this position for 5 seconds while you keep your back relaxed. 13. Slowly return to lying flat on the floor. Bridges Repeat these steps 10 times: 5. Lie on your back on a firm surface. 6. Bend your knees so they are pointing toward the ceiling and your feet are flat on the floor. 7. Tighten your buttocks muscles and lift your buttocks off of the floor until your waist is at almost the same height as your knees. You should feel the muscles working in your buttocks and the back of your thighs. If you do not feel these muscles, slide your feet 1-2 inches farther away from your buttocks. 8. Hold this position for 3-5 seconds. 9. Slowly lower your hips to the starting position, and allow your buttocks muscles to relax completely. If this exercise is too easy, try doing it with your arms crossed over your chest. Abdominal Crunches Repeat these steps 5-10 times: 11. Lie on your back on a firm bed or the floor with your legs extended. 12. Bend your knees so they are pointing toward the ceiling and your feet are flat on the floor. 71. Cross your arms over your chest. 14. Tip your chin slightly toward your chest without bending your neck. 2. Tighten your abdominal muscles and slowly raise your trunk (torso) high enough to lift your shoulder blades a tiny bit off of the floor. Avoid raising your torso higher than that, because it can put too much stress on your low back and it does not help to strengthen your abdominal muscles. 16. Slowly return to your starting position. Back Lifts Repeat these steps 5-10 times: 1. Lie on your abdomen (face-down) with your arms at your sides, and rest your forehead on the floor. 2. Tighten the muscles in your legs and your buttocks. 3. Slowly lift your chest off of the floor while you  keep your hips pressed to the floor. Keep the back of your head in line with the curve in your back. Your eyes should be looking at the  floor. 4. Hold this position for 3-5 seconds. 5. Slowly return to your starting position. SEEK MEDICAL CARE IF:  Your back pain or discomfort gets much worse when you do an exercise.  Your back pain or discomfort does not lessen within 2 hours after you exercise. If you have any of these problems, stop doing these exercises right away. Do not do them again unless your health care provider says that you can. SEEK IMMEDIATE MEDICAL CARE IF:  You develop sudden, severe back pain. If this happens, stop doing the exercises right away. Do not do them again unless your health care provider says that you can.   This information is not intended to replace advice given to you by your health care provider. Make sure you discuss any questions you have with your health care provider.  Follow up with orthopedic provider if symptoms do not improve. Apply ice to affected area. Take Flexeril and naproxen as needed for pain and muscle spasm. Return to the emergency department if you expands worsening of her symptoms, bowel or bladder incontinence, numbness/tingling in both lower extremities.

## 2015-07-18 ENCOUNTER — Encounter (HOSPITAL_COMMUNITY): Payer: Self-pay | Admitting: Emergency Medicine

## 2015-07-18 ENCOUNTER — Emergency Department (HOSPITAL_COMMUNITY)
Admission: EM | Admit: 2015-07-18 | Discharge: 2015-07-18 | Disposition: A | Discharge status: Home / Self Care | Payer: Self-pay | Attending: Emergency Medicine | Admitting: Emergency Medicine

## 2015-07-18 DIAGNOSIS — Z791 Long term (current) use of non-steroidal anti-inflammatories (NSAID): Secondary | ICD-10-CM | POA: Insufficient documentation

## 2015-07-18 DIAGNOSIS — K047 Periapical abscess without sinus: Secondary | ICD-10-CM | POA: Insufficient documentation

## 2015-07-18 DIAGNOSIS — Z872 Personal history of diseases of the skin and subcutaneous tissue: Secondary | ICD-10-CM | POA: Insufficient documentation

## 2015-07-18 DIAGNOSIS — Z8659 Personal history of other mental and behavioral disorders: Secondary | ICD-10-CM | POA: Insufficient documentation

## 2015-07-18 DIAGNOSIS — F17210 Nicotine dependence, cigarettes, uncomplicated: Secondary | ICD-10-CM | POA: Insufficient documentation

## 2015-07-18 MED ORDER — BUPIVACAINE-EPINEPHRINE (PF) 0.5% -1:200000 IJ SOLN
1.8000 mL | Freq: Once | INTRAMUSCULAR | Status: AC
  Administered 2015-07-18: 1.8 mL
  Filled 2015-07-18: qty 1.8

## 2015-07-18 MED ORDER — METRONIDAZOLE 500 MG PO TABS: 500.0000 mg | ORAL_TABLET | Freq: Two times a day (BID) | ORAL | Status: DC

## 2015-07-18 MED ORDER — METRONIDAZOLE 500 MG PO TABS
500.0000 mg | ORAL_TABLET | Freq: Once | ORAL | Status: AC
  Administered 2015-07-18: 500 mg via ORAL
  Filled 2015-07-18: qty 1

## 2015-07-18 NOTE — ED Notes (Signed)
Pt brought in by EMS for c/o facial swelling  Pt states it is the left side of his face and may be due to bad tooth

## 2015-07-18 NOTE — ED Notes (Signed)
Patient can be discharged at 6:40.

## 2015-07-18 NOTE — ED Provider Notes (Signed)
CSN: [id number]     Arrival date & time 07/18/15  0305 History   First MD Initiated Contact with Patient 07/18/15 0351     Chief Complaint  Patient presents with  . Dental Pain     (Consider location/radiation/quality/duration/timing/severity/associated sxs/prior Treatment) HPI   Blood pressure 113/68, pulse 74, temperature 98.2 F (36.8 C), temperature source Oral, resp. rate 19, height 5' 9.5" (1.765 m), weight 55.792 kg, SpO2 100 %.  Lucas Reynolds is a 29 y.o. male complaining of dental pain and dental swelling onset 2 days ago. Patient denies fever, chills, difficulty swallowing. Rates his pain at 8 out of 10, multiple over-the-counter pain medications taken with little relief.  Past Medical History  Diagnosis Date  . Facial cellulitis     2/2 dental abscess (tooth #7)  . Dental abscess   . Depression    History reviewed. No pertinent past surgical history. History reviewed. No pertinent family history. Social History  Substance Use Topics  . Smoking status: Current Every Day Smoker -- 1.00 packs/day    Types: Cigarettes  . Smokeless tobacco: Never Used  . Alcohol Use: Yes     Comment: Occasional    Review of Systems  10 systems reviewed and found to be negative, except as noted in the HPI.   Allergies  Review of patient's allergies indicates no known allergies.  Home Medications   Prior to Admission medications   Medication Sig Start Date End Date Taking? Authorizing Provider  ibuprofen (ADVIL,MOTRIN) 200 MG tablet Take 400 mg by mouth every 6 (six) hours as needed for moderate pain.    Yes Historical Provider, MD  clindamycin (CLEOCIN) 150 MG capsule Take 1 capsule (150 mg total) by mouth every 6 (six) hours. Patient not taking: Reported on 07/18/2015 02/18/15   Eyvonne Mechanic, PA-C  cyclobenzaprine (FLEXERIL) 10 MG tablet Take 1 tablet (10 mg total) by mouth 2 (two) times daily as needed for muscle spasms. 07/01/15   Samantha Tripp Dowless, PA-C   HYDROcodone-acetaminophen (NORCO/VICODIN) 5-325 MG per tablet Take 2 tablets by mouth every 4 (four) hours as needed. Patient not taking: Reported on 07/18/2015 02/18/15   Eyvonne Mechanic, PA-C  naproxen (NAPROSYN) 375 MG tablet Take 1 tablet (375 mg total) by mouth 2 (two) times daily. 07/01/15   Samantha Tripp Dowless, PA-C  oxyCODONE-acetaminophen (PERCOCET) 10-325 MG per tablet Take 0.5-1 tablets by mouth every 4 (four) hours as needed for pain. Patient not taking: Reported on 02/18/2015 06/09/14   Arthor Captain, PA-C  penicillin v potassium (VEETID) 500 MG tablet Take 1 tablet (500 mg total) by mouth 3 (three) times daily. Patient not taking: Reported on 02/18/2015 06/09/14   Arthor Captain, PA-C   BP 103/66 mmHg  Pulse 77  Temp(Src) 98.2 F (36.8 C) (Oral)  Ht 5' 9.5" (1.765 m)  Wt 55.792 kg  BMI 17.91 kg/m2  SpO2 100% Physical Exam  Constitutional: He is oriented to person, place, and time. He appears well-developed and well-nourished. No distress.  HENT:  Head: Normocephalic.  Mouth/Throat: Oropharynx is clear and moist.  Generally poor dentition, focal gingival swelling and fluctuance to left mandibular gums.  Patient is handling their secretions. There is no tenderness to palpation or firmness underneath tongue bilaterally. No trismus.    Eyes: Conjunctivae and EOM are normal. Pupils are equal, round, and reactive to light.  Neck: Normal range of motion.  Cardiovascular: Normal rate.   Pulmonary/Chest: Effort normal. No stridor.  Abdominal: Soft.  Musculoskeletal: Normal range of motion.  Lymphadenopathy:  He has no cervical adenopathy.  Neurological: He is alert and oriented to person, place, and time.  Psychiatric: He has a normal mood and affect.  Nursing note and vitals reviewed.   ED Course  .Nerve Block Date/Time: 07/18/2015 6:21 AM Performed by: Wynetta EmeryPISCIOTTA, Eleonora Peeler Authorized by: Wynetta EmeryPISCIOTTA, Kash Mothershead Consent: Verbal consent obtained. Risks and benefits: risks,  benefits and alternatives were discussed Consent given by: patient Patient identity confirmed: verbally with patient Indications: pain relief Body area: face/mouth Laterality: left Patient sedated: no Preparation: Patient was prepped and draped in the usual sterile fashion. Patient position: sitting Needle gauge: 27 G Location technique: anatomical landmarks Anesthetic total: 0.9 ml Outcome: pain improved Patient tolerance: Patient tolerated the procedure well with no immediate complications Comments: When performing injection, Pt states that that he is having chest pain.   (including critical care time) Labs Review Labs Reviewed - No data to display  Imaging Review No results found. I have personally reviewed and evaluated these images and lab results as part of my medical decision-making.   EKG Interpretation None      MDM   Final diagnoses:  Dental abscess    Filed Vitals:   07/18/15 0534 07/18/15 0551 07/18/15 0602 07/18/15 0615  BP: 120/79  113/68   Pulse: 72 79 74 77  Temp:      TempSrc:      Resp: 17 17 19 18   Height:      Weight:      SpO2: 100% 100% 100% 100%    Medications  bupivacaine-epinephrine (MARCAINE W/ EPI) 0.5% -1:200000 injection 1.8 mL (1.8 mLs Infiltration Given by Other 07/18/15 0534)  metroNIDAZOLE (FLAGYL) tablet 500 mg (500 mg Oral Given 07/18/15 0601)    Lucas Reynolds is 29 y.o. male presenting with dental pain and swelling just with abscess, would like to perform I and D however, when I was injecting this patient he states he had chest pain. No significant tachycardia, twelve-lead with no abnormality. Patient will be given Flagyl and dental follow-up.  Evaluation does not show pathology that would require ongoing emergent intervention or inpatient treatment. Pt is hemodynamically stable and mentating appropriately. Discussed findings and plan with patient/guardian, who agrees with care plan. All questions answered. Return precautions  discussed and outpatient follow up given.   New Prescriptions   METRONIDAZOLE (FLAGYL) 500 MG TABLET    Take 1 tablet (500 mg total) by mouth 2 (two) times daily. One tab PO bid x 10 days         Wynetta Emeryicole Jaiden Wahab, PA-C 07/18/15 [phone]  April Palumbo, MD 07/18/15 [phone]

## 2015-07-18 NOTE — Discharge Instructions (Signed)
Eat a soft diet for the next 24-48 hours.  Rinse your mouth with saltwater or regular water after you eat any food.  Do not swallow blood or pus becuase it will cause you to vomit,  spit out instead.  Return to the emergency room for fever, change in vision, redness to the face that rapidly spreads towards the eye, nausea or vomiting, difficulty swallowing or shortness of breath.   Apply warm compresses to jaw throughout the day.   Take your antibiotics as directed and to the end of the course. DO NOT drink alcohol when taking metronidazole, it will make you very sick!   Followup with a dentist is very important for ongoing evaluation and management of recurrent dental pain. Return to emergency department for emergent changing or worsening symptoms."  Low-cost dental clinic: [facility]**David  Civils  at [phone]**  **Nuala AlphaJanna Civils at [phone] [address]**    You may also call [phone]  Dental Assistance If the dentist on-call cannot see you, please use the resources below:   Patients with Medicaid: [facility] (949) [phone], [phone] [address], [phone]  If unable to pay, or uninsured, contact [facility] [phone]) or [facility] [phone] in [place], [phone] in [facility]) to become qualified for the adult dental clinic  Other Low-Cost Community Dental Services: [facility]- [address], [place], [place], [address]    [phone], Ext. [phone]    2nd and 4th Thursday of the month at 6:30am    10 clients each day by appointment, can sometimes see walk-in     patients if someone does not show for an appointment [facility]- [address], [place], [place], [address]    [phone] [facility]- [address], [place], [place], [address]    [phone]  [facility]- [phone] [facility]- [phone] [facility]- [phone]

## 2015-07-18 NOTE — ED Notes (Signed)
PA at bedside.

## 2015-07-18 NOTE — ED Notes (Signed)
Discharge instructions, follow up care, and rx x1 reviewed with patient. Patient verbalized understanding. 

## 2016-09-30 ENCOUNTER — Emergency Department (HOSPITAL_COMMUNITY)
Admission: EM | Admit: 2016-09-30 | Discharge: 2016-09-30 | Disposition: A | Discharge status: Home / Self Care | Payer: Self-pay | Attending: Emergency Medicine | Admitting: Emergency Medicine

## 2016-09-30 ENCOUNTER — Encounter (HOSPITAL_COMMUNITY): Payer: Self-pay

## 2016-09-30 DIAGNOSIS — Z79899 Other long term (current) drug therapy: Secondary | ICD-10-CM | POA: Insufficient documentation

## 2016-09-30 DIAGNOSIS — R21 Rash and other nonspecific skin eruption: Secondary | ICD-10-CM | POA: Insufficient documentation

## 2016-09-30 DIAGNOSIS — L0291 Cutaneous abscess, unspecified: Secondary | ICD-10-CM

## 2016-09-30 DIAGNOSIS — F17210 Nicotine dependence, cigarettes, uncomplicated: Secondary | ICD-10-CM | POA: Insufficient documentation

## 2016-09-30 DIAGNOSIS — L02214 Cutaneous abscess of groin: Secondary | ICD-10-CM | POA: Insufficient documentation

## 2016-09-30 MED ORDER — SULFAMETHOXAZOLE-TRIMETHOPRIM 800-160 MG PO TABS
1.0000 | ORAL_TABLET | Freq: Two times a day (BID) | ORAL | 0 refills | Status: AC
Start: 2016-09-30 — End: 2016-10-07

## 2016-09-30 MED ORDER — LIDOCAINE HCL 2 % IJ SOLN
10.0000 mL | Freq: Once | INTRAMUSCULAR | Status: AC
  Administered 2016-09-30: 200 mg
  Filled 2016-09-30: qty 20

## 2016-09-30 MED ORDER — PERMETHRIN 1 % EX LOTN: 1.0000 "application " | TOPICAL_LOTION | Freq: Once | CUTANEOUS | 0 refills | Status: AC

## 2016-09-30 NOTE — ED Provider Notes (Signed)
[facility]EMER[facility]ENCY DEPT Provider Note   CSN: [id number] Arrival date & time: 09/30/16  1110  By signing my name below, I, Lucas Reynolds, attest that this documentation has been prepared under the direction and in the presence of Liliana Dang P. Leonor Darnell, PA-C. Electronically Signed: Marnette Burgess Reynolds, Scribe. 09/30/2016. 11:49 AM.  History   Chief Complaint Chief Complaint  Patient presents with  . Rash  . Abscess    The history is provided by the patient and medical records. No language interpreter was used.  Abscess  Associated symptoms: no fever     HPI Comments: Lucas Reynolds is a 31 y.o. male who presents to the Emergency Department complaining of a moderate, gradually worsening area of 8/10 pain and swelling to the left groin onset one week. Pt reports this swollen area along with an associated rash is quite itchy beginning a week ago but worsening over the past three days. Patient states that one of his friends has a similar rash and was told that he had bed bugs in his home. Patient has + exposure to friend's home with rash beginning shortly after exposure.  He did not try anything at home prior to arrival for his symptoms. Denies fever, chills, drainage from the area. Pt is a current every day smoker.  MALE CHAPERONE PRESENT FOR PHYSICAL EXAMINATION AND PROCEDURE Past Medical History:  Diagnosis Date  . Dental abscess   . Depression   . Facial cellulitis    2/2 dental abscess (tooth #7)    There are no active problems to display for this patient.   History reviewed. No pertinent surgical history.   Home Medications    Prior to Admission medications   Medication Sig Start Date End Date Taking? Authorizing Provider  clindamycin (CLEOCIN) 150 MG capsule Take 1 capsule (150 mg total) by mouth every 6 (six) hours. Patient not taking: Reported on 07/18/2015 02/18/15   Eyvonne Mechanic, PA-C  cyclobenzaprine (FLEXERIL) 10 MG tablet Take 1 tablet (10 mg total) by mouth 2 (two) times daily  as needed for muscle spasms. 07/01/15   Samantha Tripp Dowless, PA-C  HYDROcodone-acetaminophen (NORCO/VICODIN) 5-325 MG per tablet Take 2 tablets by mouth every 4 (four) hours as needed. Patient not taking: Reported on 07/18/2015 02/18/15   Eyvonne Mechanic, PA-C  ibuprofen (ADVIL,MOTRIN) 200 MG tablet Take 400 mg by mouth every 6 (six) hours as needed for moderate pain.     Historical Provider, MD  metroNIDAZOLE (FLAGYL) 500 MG tablet Take 1 tablet (500 mg total) by mouth 2 (two) times daily. One tab PO bid x 10 days 07/18/15   Joni Reining Pisciotta, PA-C  naproxen (NAPROSYN) 375 MG tablet Take 1 tablet (375 mg total) by mouth 2 (two) times daily. 07/01/15   Samantha Tripp Dowless, PA-C  oxyCODONE-acetaminophen (PERCOCET) 10-325 MG per tablet Take 0.5-1 tablets by mouth every 4 (four) hours as needed for pain. Patient not taking: Reported on 02/18/2015 06/09/14   Arthor Captain, PA-C  penicillin v potassium (VEETID) 500 MG tablet Take 1 tablet (500 mg total) by mouth 3 (three) times daily. Patient not taking: Reported on 02/18/2015 06/09/14   Arthor Captain, PA-C  permethrin (ELIMITE) 1 % lotion Apply 1 application topically once. Repeat in one week if needed. 09/30/16 09/30/16  Chase Picket Xolani Degracia, PA-C  sulfamethoxazole-trimethoprim (BACTRIM DS,SEPTRA DS) 800-160 MG tablet Take 1 tablet by mouth 2 (two) times daily. 09/30/16 10/07/16  Chase Picket Donnette Macmullen, PA-C    Family History History reviewed. No pertinent family history.  Social History  Social History  Substance Use Topics  . Smoking status: Current Every Day Smoker    Packs/day: 1.00    Types: Cigarettes  . Smokeless tobacco: Never Used  . Alcohol use Yes     Comment: Occasional     Allergies   Patient has no known allergies.   Review of Systems Review of Systems  Constitutional: Negative for chills and fever.  Genitourinary: Negative for discharge, dysuria, penile pain, penile swelling, scrotal swelling and testicular pain.    Musculoskeletal: Positive for myalgias.  Skin: Positive for rash.     Physical Exam Updated Vital Signs BP 143/89 (BP Location: Right Arm)   Pulse 69   Temp 99 F (37.2 C) (Oral)   Resp 13   SpO2 100%   Physical Exam  Constitutional: He appears well-developed and well-nourished. No distress.  HENT:  Head: Normocephalic and atraumatic.  Neck: Neck supple.  Cardiovascular: Normal rate, regular rhythm and normal heart sounds.   No murmur heard. Pulmonary/Chest: Effort normal and breath sounds normal. No respiratory distress. He has no wheezes.  Musculoskeletal: Normal range of motion.  Neurological: He is alert.  Skin: Skin is warm and dry.  Diffuse erythematous papular rash with intermittent excoriations sparing palms/soles. Left groin 2x2 area of fluctuance.   Nursing note and vitals reviewed.  MALE CHAPERONE PRESENT FOR PHYSICAL EXAMINATION AND PROCEDURE  ED Treatments / Results  DIAGNOSTIC STUDIES:  Oxygen Saturation is 98% on RA, normal by my interpretation.    COORDINATION OF CARE:  11:48 AM Discussed treatment plan with pt at bedside including I&D, Permethrin, and Abx and pt agreed to plan.  Labs (all labs ordered are listed, but only abnormal results are displayed) Labs Reviewed - No data to display  EKG  EKG Interpretation None       Radiology No results found.  Procedures Procedures  INCISION AND DRAINAGE PROCEDURE NOTE: Patient identification was confirmed and verbal consent was obtained. This procedure was performed by Cameron ProudJaime P. Kallen Delatorre, PA-C at 12:17 PM. Site: Left groin, US use to confirm location and amount Sterile procedures observed: Three betadine swabs Needle size: 25 gauge Anesthetic used (type and amt): Lidocaine 2% without epi, 2mL Blade size: 11 Drainage: Purulent and bloody discharge Complexity: Complex Packing used: Yes 1/4 inch Site anesthetized, incision made over site, wound drained and explored loculations, wound packed with  sterile gauze, covered with dry, sterile dressing. Pt tolerated procedure well without complications.  Instructions for care discussed verbally and pt provided with additional written instructions for homecare and f/u.  EMERGENCY DEPARTMENT US SOFT TISSUE INTERPRETATION "Study: Limited Soft Tissue Ultrasound"  INDICATIONS: Soft tissue infection Multiple views of the body part were obtained in real-time with a multi-frequency linear probe  PERFORMED BY: Myself IMAGES ARCHIVED?: No SIDE:Left BODY PART:Lower extremity INTERPRETATION:  Abcess present    Medications Ordered in ED Medications  lidocaine (XYLOCAINE) 2 % (with pres) injection 200 mg (200 mg Infiltration Given 09/30/16 1157)     Initial Impression / Assessment and Plan / ED Course  I have reviewed the triage vital signs and the nursing notes.  Pertinent labs & imaging results that were available during my care of the patient were reviewed by me and considered in my medical decision making (see chart for details).    Renford DillsShawn Murin is a 31 y.o. male who presents to ED for two complaints:   1. Patient presenting with abscess requiring incision and drainage. + surrounding erythema. No crepitance to suggest necrotizing fasciitis. Incision and drainage  performed per procedure note. Patient tolerated the procedure well. Patient was prescribed Bactrim. Wound care instructions discussed. Wound check in 2-3 days. Return to ER if concern for spread of infection, increasing pain, fevers, or other concerns. All questions answered.   2. Generalized rash sparing palms/soles with + exposure to bed bugs. Patient denies any difficulty breathing or swallowing. Pt has a patent airway without stridor and is handling secretions without difficulty; no angioedema. No blisters, no pustules, no warmth, no draining sinus tracts, no vesicles, no desquamation, no target lesions with dusky purpura or a central bulla. Not tender to touch. No concern for  superimposed infection. No concern for SJS, TEN, TSS, tick borne illness, syphilis or other life-threatening condition. I have discussed home precautions for bedbugs and importance of calling the exterminator to check his home. All questions answered.    Final Clinical Impressions(s) / ED Diagnoses   Final diagnoses:  Abscess  Rash    New Prescriptions Discharge Medication List as of 09/30/2016 12:59 PM    START taking these medications   Details  permethrin (ELIMITE) 1 % lotion Apply 1 application topically once. Repeat in one week if needed., Starting Wed 09/30/2016, Print    sulfamethoxazole-trimethoprim (BACTRIM DS,SEPTRA DS) 800-160 MG tablet Take 1 tablet by mouth 2 (two) times daily., Starting Wed 09/30/2016, Until Wed 10/07/2016, Print       I personally performed the services described in this documentation, which was scribed in my presence. The recorded information has been reviewed and is accurate.     [facility] Carletha Dawn, PA-C 09/30/16 1408    Canary Brim Tegeler, MD 09/30/16 1415

## 2016-09-30 NOTE — ED Notes (Signed)
Called pt's name no one answered.

## 2016-09-30 NOTE — Discharge Instructions (Signed)
Please take all of your antibiotics until finished!   Benadryl as needed for itching.  Please see handout given for further information on bed bugs.   Follow up with your doctor, an urgent care, or return to ED in order to remove your packing in 48-72 hours. Return to the emergency department if you develop a fever, your abscess appears to become infected (growing surrounding redness and warmth), new or worsening symptoms develop, any additional concerns.   Abscess An abscess (boil or furuncle) is an infected area that contains a collection of pus.   SYMPTOMS Signs and symptoms of an abscess include pain, tenderness, redness, or hardness. You may feel a moveable soft area under your skin. An abscess can occur anywhere in the body.   TREATMENT  A surgical cut (incision) may be made over your abscess to drain the pus. Gauze may be packed into the space or a drain may be looped through the abscess cavity (pocket). This provides a drain that will allow the cavity to heal from the inside outwards. The abscess may be painful for a few days, but should feel much better if it was drained.  Your abscess, if seen early, may not have localized and may not have been drained. If not, another appointment may be required if it does not get better on its own or with medications.  HOME CARE INSTRUCTIONS  Keep the skin and clothes clean around your abscess.  If the abscess was drained, you will need to use gauze dressing to collect any draining pus. Dressings will typically need to be changed 3 or more times a day.  The infection may spread by skin contact with others. Avoid skin contact as much as possible.  Practice good hygiene. This includes regular hand washing, cover any draining skin lesions, and do not share personal care items.  SEEK MEDICAL CARE IF:  You develop increased pain, swelling, redness, drainage, or bleeding in the wound site.  You develop signs of generalized infection including muscle  aches, chills, fever, or a general ill feeling.  You have an oral temperature above 102 F (38.9 C).  MAKE SURE YOU:  Understand these instructions.  Will watch your condition.  Will get help right away if you are not doing well or get worse.  Document Released: 04/15/2005 Document Revised: 03/18/2011 Document Reviewed: 02/07/2008 [facility] Patient Information 2012 [place] [place].

## 2016-09-30 NOTE — ED Notes (Signed)
I&D kit in pt's room

## 2016-09-30 NOTE — ED Triage Notes (Signed)
Pt reports he has a rash hat has been there X1 week. Some improvement noted but has come back. He also reports an abscess in the groin region.

## 2016-10-02 ENCOUNTER — Emergency Department (HOSPITAL_COMMUNITY)
Admission: EM | Admit: 2016-10-02 | Discharge: 2016-10-02 | Disposition: A | Discharge status: Home / Self Care | Payer: Self-pay | Attending: Dermatology | Admitting: Dermatology

## 2016-10-02 DIAGNOSIS — Z79899 Other long term (current) drug therapy: Secondary | ICD-10-CM | POA: Insufficient documentation

## 2016-10-02 DIAGNOSIS — F17210 Nicotine dependence, cigarettes, uncomplicated: Secondary | ICD-10-CM | POA: Insufficient documentation

## 2016-10-02 DIAGNOSIS — Z5321 Procedure and treatment not carried out due to patient leaving prior to being seen by health care provider: Secondary | ICD-10-CM | POA: Insufficient documentation

## 2016-10-02 DIAGNOSIS — L98499 Non-pressure chronic ulcer of skin of other sites with unspecified severity: Secondary | ICD-10-CM | POA: Insufficient documentation

## 2016-10-02 NOTE — ED Notes (Signed)
RN called patient to update on status and update vital signs-- no answer

## 2016-10-02 NOTE — ED Triage Notes (Signed)
Pt reports having I&D on R inguinal area abcess x 2 days ago with packing present, pt reports starting antibiotics yesterday & then vomiting three times, pt c/o chills, packing in place, no peri incision redness present, A&O x4

## 2016-11-27 ENCOUNTER — Encounter (HOSPITAL_COMMUNITY): Payer: Self-pay | Admitting: Nurse Practitioner

## 2016-11-27 ENCOUNTER — Emergency Department (HOSPITAL_COMMUNITY)
Admission: EM | Admit: 2016-11-27 | Discharge: 2016-11-27 | Disposition: A | Discharge status: Home / Self Care | Payer: Self-pay | Attending: Emergency Medicine | Admitting: Emergency Medicine

## 2016-11-27 DIAGNOSIS — M25571 Pain in right ankle and joints of right foot: Secondary | ICD-10-CM

## 2016-11-27 DIAGNOSIS — F17210 Nicotine dependence, cigarettes, uncomplicated: Secondary | ICD-10-CM | POA: Insufficient documentation

## 2016-11-27 DIAGNOSIS — M79671 Pain in right foot: Secondary | ICD-10-CM | POA: Insufficient documentation

## 2016-11-27 DIAGNOSIS — Z5900 Homelessness unspecified: Secondary | ICD-10-CM

## 2016-11-27 DIAGNOSIS — M25572 Pain in left ankle and joints of left foot: Secondary | ICD-10-CM

## 2016-11-27 DIAGNOSIS — R21 Rash and other nonspecific skin eruption: Secondary | ICD-10-CM | POA: Insufficient documentation

## 2016-11-27 DIAGNOSIS — M79672 Pain in left foot: Secondary | ICD-10-CM | POA: Insufficient documentation

## 2016-11-27 DIAGNOSIS — Z79899 Other long term (current) drug therapy: Secondary | ICD-10-CM | POA: Insufficient documentation

## 2016-11-27 MED ORDER — ACETAMINOPHEN 500 MG PO TABS: 500.0000 mg | ORAL_TABLET | Freq: Four times a day (QID) | ORAL | 0 refills | Status: DC | PRN

## 2016-11-27 MED ORDER — ACETAMINOPHEN 325 MG PO TABS
650.0000 mg | ORAL_TABLET | Freq: Once | ORAL | Status: AC | PRN
  Administered 2016-11-27: 650 mg via ORAL
  Filled 2016-11-27: qty 2

## 2016-11-27 NOTE — ED Provider Notes (Signed)
[facility]-EMERGENCY DEPT Provider Note   CSN: [id number] Arrival date & time: 11/27/16  1543   By signing my name below, I, Teofilo Pod, attest that this documentation has been prepared under the direction and in the presence of Fayrene Helper, PA-C. Electronically Signed: Teofilo Pod, ED Scribe. 11/27/2016. 4:58 PM.   History   Chief Complaint Chief Complaint  Patient presents with  . Rash    Generalized  . Fatigue  . Foot Pain    The history is provided by the patient. No language interpreter was used.   HPI Comments:  Lucas Reynolds is a 31 y.o. male who presents to the Emergency Department complaining of an ongoing rash on his left foot x 3 months. Pt is homeless as of last night and [place] the bridge and he reports that he was kicked out of his fathers house last night. He states that the rash area is painful, and reports a sharp, stabbing pain to the sole of his left foot. Rash has been ongoing for several months.  Was told that he has scabies.  Pt reports that he had an episode of heat exhaustion and syncope today after laying in the sun for too long today.  Pt complains of associated generalized body aches,and nausea. Pt does not have a PCP at this time. No alleviating factors noted. Pt denies other associated symptoms.   Past Medical History:  Diagnosis Date  . Dental abscess   . Depression   . Facial cellulitis    2/2 dental abscess (tooth #7)    There are no active problems to display for this patient.   History reviewed. No pertinent surgical history.     Home Medications    Prior to Admission medications   Medication Sig Start Date End Date Taking? Authorizing Provider  clindamycin (CLEOCIN) 150 MG capsule Take 1 capsule (150 mg total) by mouth every 6 (six) hours. Patient not taking: Reported on 07/18/2015 02/18/15   Hedges, Tinnie Gens, PA-C  cyclobenzaprine (FLEXERIL) 10 MG tablet Take 1 tablet (10 mg total) by mouth 2 (two) times daily as  needed for muscle spasms. 07/01/15   Dowless, Lelon Mast Tripp, PA-C  HYDROcodone-acetaminophen (NORCO/VICODIN) 5-325 MG per tablet Take 2 tablets by mouth every 4 (four) hours as needed. Patient not taking: Reported on 07/18/2015 02/18/15   Hedges, Tinnie Gens, PA-C  ibuprofen (ADVIL,MOTRIN) 200 MG tablet Take 400 mg by mouth every 6 (six) hours as needed for moderate pain.     [provider]  metroNIDAZOLE (FLAGYL) 500 MG tablet Take 1 tablet (500 mg total) by mouth 2 (two) times daily. One tab PO bid x 10 days 07/18/15   Pisciotta, Joni Reining, PA-C  naproxen (NAPROSYN) 375 MG tablet Take 1 tablet (375 mg total) by mouth 2 (two) times daily. 07/01/15   Dowless, Lelon Mast Tripp, PA-C  oxyCODONE-acetaminophen (PERCOCET) 10-325 MG per tablet Take 0.5-1 tablets by mouth every 4 (four) hours as needed for pain. Patient not taking: Reported on 02/18/2015 06/09/14   Arthor Captain, PA-C  penicillin v potassium (VEETID) 500 MG tablet Take 1 tablet (500 mg total) by mouth 3 (three) times daily. Patient not taking: Reported on 02/18/2015 06/09/14   Arthor Captain, PA-C    Family History History reviewed. No pertinent family history.  Social History Social History  Substance Use Topics  . Smoking status: Current Every Day Smoker    Packs/day: 1.00    Types: Cigarettes  . Smokeless tobacco: Never Used  . Alcohol use Yes  Comment: Occasional     Allergies   Patient has no known allergies.   Review of Systems Review of Systems  Constitutional: Positive for fatigue.  Musculoskeletal: Positive for myalgias.  Skin: Positive for rash.  Neurological: Positive for syncope.  All other systems reviewed and are negative.    Physical Exam Updated Vital Signs BP 121/76 (BP Location: Left Arm)   Pulse 86   Temp 97.9 F (36.6 C) (Oral)   Resp 14   SpO2 100%   Physical Exam  Constitutional: He appears well-developed and well-nourished. No distress.  HENT:  Head: Normocephalic and atraumatic.   Eyes: Conjunctivae are normal.  Cardiovascular: Normal rate.   Pulmonary/Chest: Effort normal.  Abdominal: He exhibits no distension.  Neurological: He is alert. GCS eye subscore is 4. GCS verbal subscore is 5. GCS motor subscore is 6.  Skin: Skin is warm and dry.  Diffuse maculopapular rash noted throughout body, not including oral mucosa, palms of hands, or bottom of feet.  Psychiatric: His affect is blunt. He is slowed. Thought content is not paranoid. He expresses no homicidal and no suicidal ideation.  Nursing note and vitals reviewed.    ED Treatments / Results  DIAGNOSTIC STUDIES:  Oxygen Saturation is 100% on RA, normal by my interpretation.    COORDINATION OF CARE:  4:50 PM  Discussed treatment plan with pt at bedside and pt agreed to plan.   Labs (all labs ordered are listed, but only abnormal results are displayed) Labs Reviewed - No data to display  EKG  EKG Interpretation None       Radiology No results found.  Procedures Procedures (including critical care time)  Medications Ordered in ED Medications - No data to display   Initial Impression / Assessment and Plan / ED Course  I have reviewed the triage vital signs and the nursing notes.  Pertinent labs & imaging results that were available during my care of the patient were reviewed by me and considered in my medical decision making (see chart for details).     BP 121/76 (BP Location: Left Arm)   Pulse 86   Temp 97.9 F (36.6 C) (Oral)   Resp 14   SpO2 100%    Final Clinical Impressions(s) / ED Diagnoses   Final diagnoses:  Homeless  Arthralgia of both feet  Rash and nonspecific skin eruption    New Prescriptions New Prescriptions   ACETAMINOPHEN (TYLENOL) 500 MG TABLET    Take 1 tablet (500 mg total) by mouth every 6 (six) hours as needed.   I personally performed the services described in this documentation, which was scribed in my presence. The recorded information has been  reviewed and is accurate.   Pt who just became homeless since yesterday after he decided not to stay with his father anymore. Decline to go into details.  He's here with multiple complaints.  c/o pain to L foot.  Foot exam unremarkable.  Likely arthralgia 2/2 prolonged walking.  No injury.  Has a chronic maculopapular rash which is throughout his entire body.  This is not new.  No systemic involvement.  Recommend f/u with PCP for further care.  Report feeling heat exhaustion from being out in the sun due to homelessness.  His vital sign is stable.  Able tolerates PO.  Encourage staying in the shade and stay hydrated.  Oral fluid given.  No acute emergent medical condition identified today.     Fayrene Helperran, Jacquiline Zurcher, PA-C 11/27/16 1707    Ray, Duwayne Heckanielle,  MD 11/28/16 1712

## 2016-11-27 NOTE — Discharge Instructions (Signed)
Use resources to find a place for your to live and find a primary care provider for further management of your health.

## 2016-11-27 NOTE — ED Triage Notes (Signed)
Pt presents with c/o generalized pain secondary to a rash that is throughout his body/skin similar to scabies rash. Admits to being homeless and sleeping at a place he noticed bed bugs, also adds that he is fatigued almost heat exhausted after laying in the sun for too long.

## 2018-05-17 ENCOUNTER — Encounter (HOSPITAL_COMMUNITY): Payer: Self-pay | Admitting: Emergency Medicine

## 2018-05-17 ENCOUNTER — Emergency Department (HOSPITAL_COMMUNITY)
Admission: EM | Admit: 2018-05-17 | Discharge: 2018-05-17 | Disposition: A | Discharge status: Home / Self Care | Payer: Self-pay | Attending: Emergency Medicine | Admitting: Emergency Medicine

## 2018-05-17 DIAGNOSIS — F17210 Nicotine dependence, cigarettes, uncomplicated: Secondary | ICD-10-CM | POA: Insufficient documentation

## 2018-05-17 DIAGNOSIS — G5621 Lesion of ulnar nerve, right upper limb: Secondary | ICD-10-CM | POA: Insufficient documentation

## 2018-05-17 MED ORDER — NAPROXEN 375 MG PO TABS: 375.0000 mg | ORAL_TABLET | Freq: Two times a day (BID) | ORAL | 0 refills | Status: DC

## 2018-05-17 NOTE — ED Provider Notes (Signed)
[facility]-EMERGENCY DEPT Provider Note   CSN: [id number] Arrival date & time: 05/17/18  1637     History   Chief Complaint Chief Complaint  Patient presents with  . Hand Pain    HPI Lucas Reynolds is a 32 y.o. male presenting today for right fourth and fifth finger tingling.  Patient states that when he woke up this morning he felt a tingling sensation to his fourth and fifth fingers of his right hand that extend up the back of his hand to his wrist.  Patient states that he is unable to move his hand due to tingling.  Patient denies injury or pain to the area.  Patient denies joint swelling or color change.  Patient states that this is never happened before.  Patient denies pain, states that it is primarily the tingling that is wearing him.  Patient states that he often sleeps on his arms and believes that he slept on his right arm last night.  Patient states that his fourth and fifth fingers are numb however when squeezed he does wince to pain.   Patient denies recent medication change, fever, nausea/vomiting, headache, vision changes, shortness of breath, color change, extremity swelling, injury or trauma, neck pain or back pain.  HPI  Past Medical History:  Diagnosis Date  . Dental abscess   . Depression   . Facial cellulitis    2/2 dental abscess (tooth #7)    There are no active problems to display for this patient.   History reviewed. No pertinent surgical history.      Home Medications    Prior to Admission medications   Medication Sig Start Date End Date Taking? Authorizing Provider  acetaminophen (TYLENOL) 500 MG tablet Take 1 tablet (500 mg total) by mouth every 6 (six) hours as needed. 11/27/16   Fayrene Helper, PA-C  clindamycin (CLEOCIN) 150 MG capsule Take 1 capsule (150 mg total) by mouth every 6 (six) hours. Patient not taking: Reported on 07/18/2015 02/18/15   Hedges, Tinnie Gens, PA-C  cyclobenzaprine (FLEXERIL) 10 MG tablet Take 1 tablet  (10 mg total) by mouth 2 (two) times daily as needed for muscle spasms. 07/01/15   Dowless, Lelon Mast Tripp, PA-C  ibuprofen (ADVIL,MOTRIN) 200 MG tablet Take 400 mg by mouth every 6 (six) hours as needed for moderate pain.     [provider]  metroNIDAZOLE (FLAGYL) 500 MG tablet Take 1 tablet (500 mg total) by mouth 2 (two) times daily. One tab PO bid x 10 days 07/18/15   Pisciotta, Joni Reining, PA-C  naproxen (NAPROSYN) 375 MG tablet Take 1 tablet (375 mg total) by mouth 2 (two) times daily. 05/17/18   Bill Salinas, PA-C  oxyCODONE-acetaminophen (PERCOCET) 10-325 MG per tablet Take 0.5-1 tablets by mouth every 4 (four) hours as needed for pain. Patient not taking: Reported on 02/18/2015 06/09/14   Arthor Captain, PA-C  penicillin v potassium (VEETID) 500 MG tablet Take 1 tablet (500 mg total) by mouth 3 (three) times daily. Patient not taking: Reported on 02/18/2015 06/09/14   Arthor Captain, PA-C    Family History No family history on file.  Social History Social History   Tobacco Use  . Smoking status: Current Every Day Smoker    Packs/day: 1.00    Types: Cigarettes  . Smokeless tobacco: Never Used  Substance Use Topics  . Alcohol use: Yes    Comment: Occasional  . Drug use: No     Allergies   Patient has no known allergies.  Review of Systems Review of Systems  Constitutional: Negative.  Negative for chills and fever.  Eyes: Negative.  Negative for visual disturbance.  Musculoskeletal: Negative.  Negative for arthralgias, back pain, myalgias and neck pain.  Skin: Negative.  Negative for color change and wound.  Neurological: Positive for weakness and numbness. Negative for dizziness, speech difficulty and headaches.   Physical Exam Updated Vital Signs BP 127/80 (BP Location: Left Arm)   Pulse 85   Temp 98.9 F (37.2 C) (Oral)   Resp 16   Ht 5\' 9"  (1.753 m)   Wt 72.6 kg   SpO2 98%   BMI 23.63 kg/m   Physical Exam  Constitutional: He appears  well-developed and well-nourished. No distress.  HENT:  Head: Normocephalic and atraumatic.  Right Ear: External ear normal.  Left Ear: External ear normal.  Nose: Nose normal.  Mouth/Throat: Uvula is midline, oropharynx is clear and moist and mucous membranes are normal.  Eyes: Pupils are equal, round, and reactive to light. EOM are normal.  Neck: Trachea normal, normal range of motion, full passive range of motion without pain and phonation normal. Neck supple. No spinous process tenderness and no muscular tenderness present. No tracheal deviation and normal range of motion present.  Cardiovascular:  Pulses:      Radial pulses are 2+ on the right side, and 2+ on the left side.  Pulmonary/Chest: Effort normal. No respiratory distress.  Abdominal: Soft. There is no tenderness. There is no rebound and no guarding.  Musculoskeletal: Normal range of motion.       Right elbow: Normal.      Right wrist: Normal.       Left wrist: Normal.       Cervical back: Normal.       Thoracic back: Normal.       Right upper arm: Normal.       Right forearm: Normal.       Left forearm: Normal.       Right hand: He exhibits no tenderness, no bony tenderness, normal capillary refill, no deformity and no swelling. Decreased sensation noted. Decreased sensation is present in the ulnar distribution. Decreased sensation is not present in the medial distribution and is not present in the radial distribution. Decreased strength noted.       Left hand: Normal.  No wasting of the right upper extremity noted.  Pressure provocative test at the cubital tunnel of the ulnar nerve exacerbated patient's tingling and hand twitching began.  No midline spinal tenderness to palpation.  No crepitus step-off or deformity of the spine noted.  No paraspinal muscular tenderness to palpation.  Both upper extremities are normal appearing.  No injury noted.  Radial pulses intact and equal bilaterally.  Capillary refill intact to all  fingers.  Sensation intact to light touch to all fingers despite patient endorsing tingling to fourth and fifth fingers of right hand.  Patient is able to lift both arms above his head and touch the hands together without difficulty.  Right hand: No gross deformities, skin intact. Fingers appear normal.  No tenderness to palpation of the right forearm, hand or wrist.  No snuffbox tenderness to palpation. No tenderness to palpation over flexor sheath.  Finger adduction/abduction intact with 4/5 strength.  Thumb opposition intact but slow. Slightly decreased active and resisted ROM to flexion/extension at wrist, MCP, PIP and DIP of all fingers.  FDS/FDP intact. Grip 4/5 strength.  Radial artery 2+ with <2sec cap refill in all fingers.  Sensation  intact to light-tough in median/ulnar/radial distributions.  Neurological: He is alert. No cranial nerve deficit or sensory deficit. He displays a negative Romberg sign. GCS eye subscore is 4. GCS verbal subscore is 5. GCS motor subscore is 6.  Mental Status: Alert, oriented, thought content appropriate, able to give a coherent history. Speech fluent without evidence of aphasia. Able to follow 2 step commands without difficulty. Cranial Nerves: II: Peripheral visual fields grossly normal, pupils equal, round, reactive to light III,IV, VI: ptosis not present, extra-ocular motions intact bilaterally V,VII: smile symmetric, eyebrows raise symmetric, facial light touch sensation equal VIII: hearing grossly normal to voice X: uvula elevates symmetrically XI: bilateral shoulder shrug symmetric and strong XII: midline tongue extension without fassiculations Motor: Normal tone. 5/5 strength in upper and lower extremities bilaterally including strong and equal grip bicep flexion, triceps extension, and dorsiflexion/plantar flexion. Patients right hand grip to 4th and 5th fingers slightly weaker compared to left. Sensory: Sensation intact to light touch in  all extremities.Negative Romberg.  Cerebellar: normal finger-to-nose with bilateral upper extremities. Normal heel-to -shin balance bilaterally of the lower extremity. No pronator drift.  Gait: normal gait and balance CV: distal pulses palpable throughout  Skin: Skin is warm, dry and intact. Capillary refill takes less than 2 seconds.  Psychiatric: He has a normal mood and affect. His behavior is normal.   ED Treatments / Results  Labs (all labs ordered are listed, but only abnormal results are displayed) Labs Reviewed - No data to display  EKG None  Radiology No results found.  Procedures Procedures (including critical care time)  Medications Ordered in ED Medications - No data to display   Initial Impression / Assessment and Plan / ED Course  I have reviewed the triage vital signs and the nursing notes.  Pertinent labs & imaging results that were available during my care of the patient were reviewed by me and considered in my medical decision making (see chart for details).  Clinical Course as of May 17 2242  Tue May 17, 2018  2028 Case discussed with Dr. Silverio Lay, likely Saturday night palsy, anti-inflammatories and neuro follow-up if symptoms do not improve.   [BM]    Clinical Course User Index [BM] Bill Salinas, PA-C   Patient presenting today for right fourth and fifth finger tingling that began when he woke up today.  Patient believes it is due to him sleeping on his right arm last night.  Patient denies injury or trauma to the area.  On examination normal-appearing bilateral upper extremities without signs of injury or infection.  Patient is reluctant to use right hand on examination however with distraction and encouragement does move the hand well.  Grip strength slightly diminished to fourth and fifth fingers of right hand.  Patient's numbness is along the ulnar nerve of the right upper extremity.  Pressure provocative test performed on the cubital tunnel which led  to exacerbation of patient's tingling and patient with some twitching of the right hand.  Believe that patient today is experiencing an ulnar neuropathy from the cubital tunnel, do not suspect central nervous system involvement today, neuro exam otherwise normal.  Patient with a strong and equal biceps flexion and triceps extension bilaterally.  Strong equal shoulder shrug bilaterally, full strength and range of motion to movements of the neck.  Patient without neck pain or tenderness.  Do not suspect cervical or brachial plexus etiology of symptoms.  Believe that patient is experiencing a peripheral mononeuropathy today.  Patient afebrile, not  tachycardic, not hypotensive, well-appearing and in no acute distress.  Patient has been informed to use anti-inflammatory medications for his symptoms and follow-up with primary care provider and neuro for further evaluation.  Patient informed to avoid over flexion of the right arm and to sleep with the arm slightly extended to help with symptoms.  Also informed not to sleep on arm.  Patient's case discussed with Dr. Silverio Lay who agrees that discharge with anti-inflammatories and neuro follow-up if symptoms worsen is appropriate at this time.  At this time there does not appear to be any evidence of an acute emergency medical condition and the patient appears stable for discharge with appropriate outpatient follow up. Diagnosis was discussed with patient who verbalizes understanding of care plan and is agreeable to discharge. I have discussed return precautions with patient who verbalizes understanding of return precautions. Patient strongly encouraged to follow-up with their PCP. All questions answered.   Note: Portions of this report may have been transcribed using voice recognition software. Every effort was made to ensure accuracy; however, inadvertent computerized transcription errors may still be present. Final Clinical Impressions(s) / ED Diagnoses   Final  diagnoses:  Cubital tunnel syndrome on right    ED Discharge Orders         Ordered    naproxen (NAPROSYN) 375 MG tablet  2 times daily     05/17/18 2040           Elizabeth Palau 05/17/18 2244    Charlynne Pander, MD 05/17/18 2350

## 2018-05-17 NOTE — ED Triage Notes (Signed)
Patient here from home with complaints of right hand numbness. Started this morning when waking up. States that "my hand is stuck like this". Odd behavior in triage. Catatonic hand.

## 2018-05-17 NOTE — Discharge Instructions (Addendum)
Please return to the Emergency Department for any new or worsening symptoms or if your symptoms do not improve. Please be sure to follow up with your Primary Care Physician as soon as possible regarding your visit today. If you do not have a Primary Doctor please use the resources below to establish one. Please use the antiinflammatory medication naproxen as prescribed. Please drink plenty of water while drinking this medication. Please follow-up with the neurology office if your symptoms do not improve. Please avoid sleeping on your arm in the future.  Contact a health care provider if: Your symptoms get worse. Your symptoms do not get better with treatment. Your have new pain. Your hand on the injured side feels numb or cold.  Do not take your medicine if  develop an itchy rash, swelling in your mouth or lips, or difficulty breathing.   RESOURCE GUIDE  Chronic Pain Problems: Contact [facility]  [phone] Patients need to be referred by their primary care doctor.  Insufficient Money for Medicine: Contact United Way:  call "211" or [facility] [phone].  No Primary Care Doctor: Call [facility]  [phone] - can help you locate a primary care doctor that  accepts your insurance, provides certain services, etc. Physician Referral Service- [phone]  Agencies that provide inexpensive medical care: [facility]  [phone] [facility]  [phone] [facility]  [phone] [facility]  [phone] Planned Parenthood  [phone] [facility]  [phone]  Medicaid-accepting [facility] Providers: [facility]- [address]  [phone], Mon-Fri 9am-7pm, Sat 9am-1pm [facility]- [address] [place], [address] [place]  [phone] [facility]- [address] [place]  [phone] [facility]- [address]  [phone] Renaye Rakers- [address] [place], [address], [phone]  Only accepts [place] patients after they have their name  applied to their card  Self Pay (no insurance) in [facility]: Sickle Cell Patients: Dr Willey Blade, [facility]  [address] [place], [phone] [facility]- [address] [place]  [phone]       [facility] [place]- [address]       -     [facility]- see information above (Speak to [employer] if you do not have insurance)       -  [facility]- [address] [place], [phone]       -  [facility]- [address],  [phone]       -  [facility]- [address], [phone]       -  Dr Julio Sicks-  [address], [place], [phone]       -  [facility]- [address] [phone]       -  [facility]- [address], [phone], also [address] [phone]       -    [facility]- [address] [place], [phone], 1st & 3rd Saturday   every month, 10am-1pm  1) Find a Doctor and Pay Out of Pocket Although you won't have to find out who is covered by your insurance plan, it is a good idea to ask around and get recommendations. You will then need to call the  office and see if the doctor you have chosen will accept you as a new patient and what types of options they offer for patients who are self-pay. Some doctors offer discounts or will set up payment plans for their patients who do not have insurance, but you will need to ask so you aren't surprised when you get to your appointment.  2) Contact Your Local Health Department Not all health departments have doctors that can see patients for sick visits, but many do, so it is worth a call to see if yours does. If you don't know where your local health department is, you can check in your phone book. The CDC also has a tool to help you locate your state's health  department, and many state websites also have listings of all of their local health departments.  3) Find a Walk-in Clinic If your illness is not likely to be very severe or complicated, you may want to try a walk in clinic. These are popping up all over the country in pharmacies, drugstores, and shopping centers. They're usually staffed by nurse practitioners or physician assistants that have been trained to treat common illnesses and complaints. They're usually fairly quick and inexpensive. However, if you have serious medical issues or chronic medical problems, these are probably not your best option  STD Testing [facility] Department of [facility], STD Clinic, [address] [place], phone [phone] or [phone].  Monday - Friday, call for an appointment. [facility] Department of [employer], STD Clinic, [place] [address], [place], phone [phone] or [phone].  Monday - Friday, call for an appointment.  Abuse/Neglect: [facility] Child Abuse Hotline 825-195-0173 [facility] Child Abuse Hotline 520-195-0918 (After Hours)  Emergency Shelter:  [facility] [phone]  Maternity Homes: [facility] 540-814-5741 [facility] [phone]  MRSA Hotline #:   757 203 3719  [facility] Resources  [facility] [place]  United Way [facility] Dept. [address]                 [address]         [address] [place] [address]  Blondell Reveal Phone:  [phone]                                  Phone:  [phone]                   Phone:  [phone]  [facility], [phone] [phone]       -     [facility] in [place], [address],                                   [phone], Insurance  [place] Child Abuse  Hotline 605-723-0298 or 858-119-3756 (After Hours)   Behavioral Health Services  Substance Abuse Resources: [facility]  [phone] [facility] [phone] [facility] [phone] [facility] 4786273403 Residential & Outpatient Substance Abuse Program  619-052-4131  Psychological Services: [facility] Health  [phone] [facility] Services  7207507267 [facility], [phone] [place]. [address], [place], ACCESS LINE: [phone] or [phone], [employer]  Dental Assistance  If unable to pay or uninsured, contact:  [facility] or [facility]. to become qualified for the adult dental clinic.  Patients with Medicaid: [facility] [phone] [address], [phone] [address] [phone]  If unable to pay, or uninsured, contact [facility] (343) [phone]) or [facility] Department [phone] in [place], [phone] in [facility] to become qualified for the adult dental clinic   Other Low-Cost Community Dental Services: [facility]- [address] [place], [place], [place], [address], [phone], [address], 2nd and 4th Thursday of the month at 6:30am.  10 clients each day by appointment, can sometimes see walk-in patients if someone does not show for an appointment. [facility]- [address] [place], [place], [address], [phone] [facility] [address] [place], [place], [address], [phone] [facility] Health Department- [phone] [facility] Health Department- [phone] [facility] [phone]

## 2018-07-14 ENCOUNTER — Encounter (HOSPITAL_COMMUNITY): Payer: Self-pay

## 2018-07-14 ENCOUNTER — Emergency Department (HOSPITAL_COMMUNITY): Payer: Self-pay

## 2018-07-14 ENCOUNTER — Other Ambulatory Visit: Payer: Self-pay

## 2018-07-14 ENCOUNTER — Emergency Department (HOSPITAL_COMMUNITY)
Admission: EM | Admit: 2018-07-14 | Discharge: 2018-07-15 | Disposition: A | Discharge status: Home / Self Care | Payer: Self-pay | Attending: Emergency Medicine | Admitting: Emergency Medicine

## 2018-07-14 DIAGNOSIS — Z79899 Other long term (current) drug therapy: Secondary | ICD-10-CM | POA: Insufficient documentation

## 2018-07-14 DIAGNOSIS — J189 Pneumonia, unspecified organism: Secondary | ICD-10-CM | POA: Insufficient documentation

## 2018-07-14 DIAGNOSIS — J181 Lobar pneumonia, unspecified organism: Secondary | ICD-10-CM

## 2018-07-14 DIAGNOSIS — F17210 Nicotine dependence, cigarettes, uncomplicated: Secondary | ICD-10-CM | POA: Insufficient documentation

## 2018-07-14 DIAGNOSIS — R05 Cough: Secondary | ICD-10-CM | POA: Insufficient documentation

## 2018-07-14 DIAGNOSIS — R0602 Shortness of breath: Secondary | ICD-10-CM | POA: Insufficient documentation

## 2018-07-14 LAB — I-STAT TROPONIN, ED: TROPONIN I, POC: 0 ng/mL (ref 0.00–0.08)

## 2018-07-14 LAB — BASIC METABOLIC PANEL
Anion gap: 6 (ref 5–15)
BUN: 5 mg/dL — AB (ref 6–20)
CALCIUM: 9.2 mg/dL (ref 8.9–10.3)
CO2: 32 mmol/L (ref 22–32)
CREATININE: 1.02 mg/dL (ref 0.61–1.24)
Chloride: 101 mmol/L (ref 98–111)
GFR calc Af Amer: >60 mL/min (ref >60)
GLUCOSE: 110 mg/dL — AB (ref 70–99)
Potassium: 3.6 mmol/L (ref 3.5–5.1)
SODIUM: 139 mmol/L (ref 135–145)

## 2018-07-14 LAB — CBC
HCT: 44.5 % (ref 39.0–52.0)
HEMOGLOBIN: 14.3 g/dL (ref 13.0–17.0)
MCH: 28.5 pg (ref 26.0–34.0)
MCHC: 32.1 g/dL (ref 30.0–36.0)
MCV: 88.6 fL (ref 80.0–100.0)
PLATELETS: 250 10*3/uL (ref 150–400)
RBC: 5.02 MIL/uL (ref 4.22–5.81)
RDW: 12.8 % (ref 11.5–15.5)
WBC: 10.2 10*3/uL (ref 4.0–10.5)
nRBC: 0 % (ref 0.0–0.2)

## 2018-07-14 NOTE — ED Triage Notes (Signed)
Central to left sided chest pain that went up into the left shoulder. EMS gave 324 ASA and 3 Nitro.  EMS vitals: 146/92 100 HR

## 2018-07-15 ENCOUNTER — Emergency Department (HOSPITAL_COMMUNITY): Payer: Self-pay

## 2018-07-15 MED ORDER — IBUPROFEN 600 MG PO TABS: 600.0000 mg | ORAL_TABLET | Freq: Three times a day (TID) | ORAL | 0 refills | Status: AC | PRN

## 2018-07-15 MED ORDER — SODIUM CHLORIDE 0.9 % IV SOLN
500.0000 mg | Freq: Once | INTRAVENOUS | Status: AC
  Administered 2018-07-15: 500 mg via INTRAVENOUS
  Filled 2018-07-15: qty 500

## 2018-07-15 MED ORDER — AZITHROMYCIN 250 MG PO TABS: 250.0000 mg | ORAL_TABLET | Freq: Every day | ORAL | 0 refills | Status: AC

## 2018-07-15 MED ORDER — SODIUM CHLORIDE 0.9 % IV BOLUS
1000.0000 mL | Freq: Once | INTRAVENOUS | Status: AC
  Administered 2018-07-15: 1000 mL via INTRAVENOUS

## 2018-07-15 MED ORDER — AMOXICILLIN-POT CLAVULANATE 875-125 MG PO TABS: 1.0000 | ORAL_TABLET | Freq: Two times a day (BID) | ORAL | 0 refills | Status: AC

## 2018-07-15 MED ORDER — SODIUM CHLORIDE 0.9 % IV SOLN
2.0000 g | Freq: Once | INTRAVENOUS | Status: AC
  Administered 2018-07-15: 2 g via INTRAVENOUS
  Filled 2018-07-15: qty 20

## 2018-07-15 MED ORDER — KETOROLAC TROMETHAMINE 15 MG/ML IJ SOLN
15.0000 mg | Freq: Once | INTRAMUSCULAR | Status: AC
  Administered 2018-07-15: 15 mg via INTRAVENOUS
  Filled 2018-07-15: qty 1

## 2018-07-15 MED ORDER — IOPAMIDOL (ISOVUE-370) INJECTION 76%
100.0000 mL | Freq: Once | INTRAVENOUS | Status: AC | PRN
  Administered 2018-07-15: 48 mL via INTRAVENOUS

## 2018-07-15 NOTE — ED Notes (Signed)
Patient transported to CT SCAN . 

## 2018-07-15 NOTE — ED Provider Notes (Signed)
[facility] Provider Note   CSN: [id number] Arrival date & time: 07/14/18  2209     History   Chief Complaint Chief Complaint  Patient presents with  . Chest Pain    HPI Lucas [facility] is a 32 y.o. male.  HPI   32 year old male with history of depression here with cough and left-sided chest pain.  Patient states that throughout the day today, he experienced gradual onset of progressively worsening sharp, pleuritic, left-sided chest pain.  He has had associated cough with sputum production.  No fevers or chills.  He denies any lower extremity swelling or recent immobilization or risk factors for pulmonary embolism.  He states that he has had some chills since the onset of pain.  The pain is worse with movement, palpation, and deep inspiration.  No alleviating factors.  No hemoptysis.  He does have multiple known sick contacts.  Past Medical History:  Diagnosis Date  . Dental abscess   . Depression   . Facial cellulitis    2/2 dental abscess (tooth #7)    There are no active problems to display for this patient.   History reviewed. No pertinent surgical history.      Home Medications    Prior to Admission medications   Medication Sig Start Date End Date Taking? Authorizing Provider  cholecalciferol (VITAMIN D3) 25 MCG (1000 UT) tablet Take 1,000 Units by mouth at bedtime.   Yes [provider]  citalopram (CELEXA) 20 MG tablet Take 20 mg by mouth at bedtime.   Yes [provider]  haloperidol (HALDOL) 10 MG tablet Take 10 mg by mouth at bedtime.   Yes [provider]  OLANZapine (ZYPREXA) 20 MG tablet Take 20 mg by mouth at bedtime.   Yes [provider]  acetaminophen (TYLENOL) 500 MG tablet Take 1 tablet (500 mg total) by mouth every 6 (six) hours as needed. Patient not taking: Reported on 07/15/2018 11/27/16   Fayrene Helper, PA-C  amoxicillin-clavulanate (AUGMENTIN) 875-125 MG tablet Take 1  tablet by mouth every 12 (twelve) hours for 7 days. 07/15/18 07/22/18  Shaune Pollack, MD  azithromycin (ZITHROMAX) 250 MG tablet Take 1 tablet (250 mg total) by mouth daily for 4 days. Take first 2 tablets together, then 1 every day until finished. 07/15/18 07/19/18  Shaune Pollack, MD  clindamycin (CLEOCIN) 150 MG capsule Take 1 capsule (150 mg total) by mouth every 6 (six) hours. Patient not taking: Reported on 07/18/2015 02/18/15   Hedges, Tinnie Gens, PA-C  cyclobenzaprine (FLEXERIL) 10 MG tablet Take 1 tablet (10 mg total) by mouth 2 (two) times daily as needed for muscle spasms. Patient not taking: Reported on 07/15/2018 07/01/15   Dowless, Lelon Mast Tripp, PA-C  ibuprofen (ADVIL,MOTRIN) 600 MG tablet Take 1 tablet (600 mg total) by mouth every 8 (eight) hours as needed for moderate pain. 07/15/18   Shaune Pollack, MD  metroNIDAZOLE (FLAGYL) 500 MG tablet Take 1 tablet (500 mg total) by mouth 2 (two) times daily. One tab PO bid x 10 days Patient not taking: Reported on 07/15/2018 07/18/15   Pisciotta, Joni Reining, PA-C  oxyCODONE-acetaminophen (PERCOCET) 10-325 MG per tablet Take 0.5-1 tablets by mouth every 4 (four) hours as needed for pain. Patient not taking: Reported on 02/18/2015 06/09/14   Arthor Captain, PA-C  penicillin v potassium (VEETID) 500 MG tablet Take 1 tablet (500 mg total) by mouth 3 (three) times daily. Patient not taking: Reported on 02/18/2015 06/09/14   Arthor Captain, PA-C  Family History History reviewed. No pertinent family history.  Social History Social History   Tobacco Use  . Smoking status: Current Every Day Smoker    Packs/day: 1.00    Types: Cigarettes  . Smokeless tobacco: Never Used  Substance Use Topics  . Alcohol use: Yes    Comment: Occasional  . Drug use: No     Allergies   Patient has no known allergies.   Review of Systems Review of Systems  Constitutional: Negative for chills, fatigue and fever.  HENT: Negative for congestion and  rhinorrhea.   Eyes: Negative for visual disturbance.  Respiratory: Positive for cough, chest tightness and shortness of breath. Negative for wheezing.   Cardiovascular: Positive for chest pain. Negative for leg swelling.  Gastrointestinal: Negative for abdominal pain, diarrhea, nausea and vomiting.  Genitourinary: Negative for dysuria and flank pain.  Musculoskeletal: Negative for neck pain and neck stiffness.  Skin: Negative for rash and wound.  Allergic/Immunologic: Negative for immunocompromised state.  Neurological: Positive for weakness. Negative for syncope and headaches.  All other systems reviewed and are negative.    Physical Exam Updated Vital Signs BP (!) 103/55   Pulse 92   Temp 99.3 F (37.4 C) (Oral)   Resp 13   Ht 5\' 9"  (1.753 m)   Wt 72.6 kg   SpO2 94%   BMI 23.63 kg/m   Physical Exam Vitals signs and nursing note reviewed.  Constitutional:      General: He is not in acute distress.    Appearance: He is well-developed.  HENT:     Head: Normocephalic and atraumatic.  Eyes:     Conjunctiva/sclera: Conjunctivae normal.  Neck:     Musculoskeletal: Neck supple.  Cardiovascular:     Rate and Rhythm: Normal rate and regular rhythm.     Heart sounds: Normal heart sounds. No murmur. No friction rub.  Pulmonary:     Effort: Pulmonary effort is normal. No respiratory distress.     Breath sounds: Examination of the left-middle field reveals decreased breath sounds. Examination of the left-lower field reveals decreased breath sounds and rales. Decreased breath sounds and rales present. No wheezing.  Abdominal:     General: There is no distension.     Palpations: Abdomen is soft.     Tenderness: There is no abdominal tenderness.  Skin:    General: Skin is warm.     Capillary Refill: Capillary refill takes less than 2 seconds.  Neurological:     Mental Status: He is alert and oriented to person, place, and time.     Motor: No abnormal muscle tone.      ED  Treatments / Results  Labs (all labs ordered are listed, but only abnormal results are displayed) Labs Reviewed  BASIC METABOLIC PANEL - Abnormal; Notable for the following components:      Result Value   Glucose, Bld 110 (*)    BUN 5 (*)    All other components within normal limits  CBC  I-STAT TROPONIN, ED    EKG EKG Interpretation  Date/Time:  Thursday July 14 2018 22:12:21 EST Ventricular Rate:  104 PR Interval:  134 QRS Duration: 100 QT Interval:  326 QTC Calculation: 428 R Axis:   65 Text Interpretation:  Sinus tachycardia Incomplete right bundle branch block Borderline ECG When compared with ECG of 07/18/2015, Incomplete right bundle branch block is now present Confirmed by Dione Booze ([id number]) on 07/14/2018 11:56:04 PM   Radiology Dg Chest 2 View  Result Date:  07/14/2018 CLINICAL DATA:  LEFT chest pain radiating to LEFT shoulder. EXAM: CHEST - 2 VIEW COMPARISON:  None. FINDINGS: Cardiomediastinal silhouette is normal. Patchy LEFT lower lobe airspace opacity. Mildly elevated LEFT hemidiaphragm. No pleural effusion. Trachea projects midline and there is no pneumothorax. Soft tissue planes and included osseous structures are non-suspicious. IMPRESSION: LEFT lower lobe pneumonia and/or atelectasis. Electronically Signed   By: Awilda Metroourtnay  Bloomer M.D.   On: 07/14/2018 22:52   Ct Angio Chest Pe W Or Wo Contrast  Result Date: 07/15/2018 CLINICAL DATA:  Acute onset of central to left-sided chest pain, radiating to the left shoulder. EXAM: CT ANGIOGRAPHY CHEST WITH CONTRAST TECHNIQUE: Multidetector CT imaging of the chest was performed using the standard protocol during bolus administration of intravenous contrast. Multiplanar CT image reconstructions and MIPs were obtained to evaluate the vascular anatomy. CONTRAST:  48mL ISOVUE-370 IOPAMIDOL (ISOVUE-370) INJECTION 76% COMPARISON:  Chest radiograph performed 07/14/2018 FINDINGS: Cardiovascular:  There is no evidence of pulmonary  embolus. The heart is normal in size. The thoracic aorta is grossly unremarkable. The great vessels are within normal limits. Mediastinum/Nodes: The mediastinum is unremarkable in appearance. No mediastinal lymphadenopathy is seen. No pericardial effusion is identified. The thyroid gland is unremarkable. No axillary lymphadenopathy is appreciated. Lungs/Pleura: Mild bibasilar airspace opacities raise concern for pneumonia. Trace left-sided pleural fluid is noted. Scattered bilateral emphysema is noted. No pneumothorax is identified. Upper Abdomen: The visualized portions of the liver and spleen are unremarkable. The visualized portions of the pancreas, adrenal glands and left kidney are within normal limits. Musculoskeletal: No acute osseous abnormalities are identified. The visualized musculature is unremarkable in appearance. Review of the MIP images confirms the above findings. IMPRESSION: 1. No evidence of pulmonary embolus. 2. Mild bibasilar airspace opacities raise concern for pneumonia. Trace left-sided pleural fluid noted. 3. Scattered bilateral emphysema noted. Electronically Signed   By: Roanna RaiderJeffery  Chang M.D.   On: 07/15/2018 04:17    Procedures Procedures (including critical care time)  Medications Ordered in ED Medications  cefTRIAXone (ROCEPHIN) 2 g in sodium chloride 0.9 % 100 mL IVPB (0 g Intravenous Stopped 07/15/18 0347)  azithromycin (ZITHROMAX) 500 mg in sodium chloride 0.9 % 250 mL IVPB (0 mg Intravenous Stopped 07/15/18 0429)  sodium chloride 0.9 % bolus 1,000 mL (0 mLs Intravenous Stopped 07/15/18 0429)  ketorolac (TORADOL) 15 MG/ML injection 15 mg (15 mg Intravenous Given 07/15/18 0313)  iopamidol (ISOVUE-370) 76 % injection 100 mL (48 mLs Intravenous Contrast Given 07/15/18 0347)     Initial Impression / Assessment and Plan / ED Course  I have reviewed the triage vital signs and the nursing notes.  Pertinent labs & imaging results that were available during my care of the  patient were reviewed by me and considered in my medical decision making (see chart for details).     32 year old male with past medical history as above here with cough and left-sided chest pain.  On exam, patient has left-sided rales.  Chest x-ray is concerning for possible pneumonia.  However, given the acuity of onset with no preceding symptoms, CT Angie obtained for evaluation of PE.  This is consistent with pneumonia with no evidence of pulmonary embolism or other complication.  Patient remains hemodynamically stable and well-appearing.  His blood pressure is at his baseline.  He is satting well on room air.  Will place on empiric antibiotics and discharge home with outpatient follow-up.  Good return precautions given.  He is able to eat and drink without difficulty.  Final Clinical  Impressions(s) / ED Diagnoses   Final diagnoses:  Community acquired pneumonia of left lower lobe of lung [facility](HCC)    ED Discharge Orders         Ordered    amoxicillin-clavulanate (AUGMENTIN) 875-125 MG tablet  Every 12 hours     07/15/18 0632    azithromycin (ZITHROMAX) 250 MG tablet  Daily     07/15/18 0632    ibuprofen (ADVIL,MOTRIN) 600 MG tablet  Every 8 hours PRN     07/15/18 [id number]           Shaune PollackIsaacs, Staton Markey, MD 07/15/18 [phone]

## 2018-07-15 NOTE — Discharge Instructions (Addendum)
Your labs and imaging today shows a PNEUMONIA.  It's important to follow-up with a doctor to make sure you are improving.  TAKE THE ANTIBIOTICS AS PRESCRIBED FOR THE FULL COURSE

## 2018-12-27 ENCOUNTER — Ambulatory Visit (INDEPENDENT_AMBULATORY_CARE_PROVIDER_SITE_OTHER): Payer: Self-pay | Admitting: Internal Medicine

## 2018-12-27 ENCOUNTER — Other Ambulatory Visit: Payer: Self-pay

## 2018-12-27 ENCOUNTER — Encounter: Payer: Self-pay | Admitting: Internal Medicine

## 2018-12-27 VITALS — BP 135/80 | HR 86 | Temp 98.7°F | Wt 158.7 lb

## 2018-12-27 DIAGNOSIS — F410 Panic disorder [episodic paroxysmal anxiety] without agoraphobia: Secondary | ICD-10-CM

## 2018-12-27 DIAGNOSIS — Z79899 Other long term (current) drug therapy: Secondary | ICD-10-CM

## 2018-12-27 DIAGNOSIS — F329 Major depressive disorder, single episode, unspecified: Secondary | ICD-10-CM | POA: Insufficient documentation

## 2018-12-27 DIAGNOSIS — K117 Disturbances of salivary secretion: Secondary | ICD-10-CM | POA: Insufficient documentation

## 2018-12-27 DIAGNOSIS — G2401 Drug induced subacute dyskinesia: Secondary | ICD-10-CM | POA: Insufficient documentation

## 2018-12-27 DIAGNOSIS — F323 Major depressive disorder, single episode, severe with psychotic features: Secondary | ICD-10-CM

## 2018-12-27 DIAGNOSIS — F32A Depression, unspecified: Secondary | ICD-10-CM | POA: Insufficient documentation

## 2018-12-27 DIAGNOSIS — F17210 Nicotine dependence, cigarettes, uncomplicated: Secondary | ICD-10-CM

## 2018-12-27 DIAGNOSIS — G2119 Other drug induced secondary parkinsonism: Secondary | ICD-10-CM

## 2018-12-27 DIAGNOSIS — F333 Major depressive disorder, recurrent, severe with psychotic symptoms: Secondary | ICD-10-CM

## 2018-12-27 DIAGNOSIS — R2689 Other abnormalities of gait and mobility: Secondary | ICD-10-CM

## 2018-12-27 MED ORDER — SCOPOLAMINE 1 MG/3DAYS TD PT72: 1.0000 | MEDICATED_PATCH | TRANSDERMAL | 5 refills | Status: AC

## 2018-12-27 NOTE — Assessment & Plan Note (Addendum)
Patient presents to establish care with complaints of 1 week of sialorrhea and shuffling gait. He follows with psychotherapeutic services who handle his mental health needs. 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 He is currently taking Zyprexa, Celexa, Haldol (150mg  IM qMonth) and recently started Ingrezza for tardive dyskinesia and benztropine (Cogentin) for excessive salivation (?and possibly for Tardive dyskinesia).  On exam Pt is alert and oriented with appropriate answers, but slow to respond to questions. This is described as baseline for him (by himself and the representative from psychotherapeutic services who is with him). He has decreased effort for his strength but this is consistent 4-5/5 through out. Sensation grossly intact. CN exam WNL except for some difficult with bilateral eyebrow raising (this may be chronic but patient is unsure) and slowness with sticking out his tongue. No cogwheel rigidity on LUE, but apparent trace cogwheel rigidity on RUE. He is not to have increased and thickened salivation.  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 Increased salivation is also a known side effect of Ingrezza and may be increased in likelihood given concurrent psychiatric medications.  We recommend weaning off one of his psychiatric medication, the newest, Ingrezza, may be the most appropriate change as Cogentin (Benztropine) can also treat his Tardive dyskinesia; and the was one of the new medications. Will defer these changes to his mental health team and the representative from psychotherapeutic services will relay this to them. Will provide scopolamine to help thin his secretions. - Scopolamine q72hr,  thick secretions - Psychiatric medication adjustments per his mental health team

## 2018-12-27 NOTE — Assessment & Plan Note (Signed)
Patient noted to have sialorrhea, suspected to be due to side effect of multiple psychiatric medications. Will defer adjustment of these medications to his psychiatric providers and prescribe scopolamine patches to help thin his secretions in the meantime. - Scopolamine patch q72hrs

## 2018-12-27 NOTE — Progress Notes (Signed)
CC: Shuffling Gait, Sialorrhea  HPI:  [name] is a 33 y.o. M with PMHx listed below presenting for Shuffling Gait, Sialorrhea. Please see the A&P for the status of the patient's chronic medical problems.   Past Medical History:  Diagnosis Date  . Dental abscess   . Depression   . Facial cellulitis    2/2 dental abscess (tooth #7)   No past surgical history on file. reviewed, denies prior surgeries.  Social History   Socioeconomic History  . Marital status: Single    Spouse name: Not on file  . Number of children: Not on file  . Years of education: Not on file  . Highest education level: Not on file  Occupational History  . Not on file  Social Needs  . Financial resource strain: Not on file  . Food insecurity:    Worry: Not on file    Inability: Not on file  . Transportation needs:    Medical: Not on file    Non-medical: Not on file  Tobacco Use  . Smoking status: Current Every Day Smoker    Packs/day: 1.00    Types: Cigarettes  . Smokeless tobacco: Never Used  Substance and Sexual Activity  . Alcohol use: Yes    Comment: Occasional  . Drug use: No  . Sexual activity: Not Currently  Lifestyle  . Physical activity:    Days per week: Not on file    Minutes per session: Not on file  . Stress: Not on file  Relationships  . Social connections:    Talks on phone: Not on file    Gets together: Not on file    Attends religious service: Not on file    Active member of club or organization: Not on file    Attends meetings of clubs or organizations: Not on file    Relationship status: Not on file  Other Topics Concern  . Not on file  Social History Narrative  . Not on file   No family history on file. Reviewed, no known fam hx.  Review of Systems:  Performed and all others negative.  Physical Exam:  Vitals:   12/27/18 1132  BP: 135/80  Pulse: 86  Temp: 98.7 F (37.1 C)  TempSrc: Oral  SpO2: 98%  Weight: 158 lb 11.2 oz (72 kg)   Physical  Exam Constitutional:      Comments: Flat affect, young male, in wheel chair  Cardiovascular:     Rate and Rhythm: Normal rate and regular rhythm.     Pulses: Normal pulses.  Pulmonary:     Effort: Pulmonary effort is normal. No respiratory distress.     Breath sounds: Normal breath sounds.  Abdominal:     General: Abdomen is flat. There is no distension.     Palpations: Abdomen is soft.     Tenderness: There is no abdominal tenderness.  Musculoskeletal:        General: No swelling or tenderness.  Skin:    General: Skin is warm and dry.  Neurological:     Mental Status: He is oriented to person, place, and time.     Comments: Mental Status: Patient is awake, alert, oriented x3 Cranial Nerves: II: Pupils equal, round, and reactive to light.  III,IV, VI: EOMI without ptosis or diploplia.  V: Facial sensation is symmetric tolight touch VII: Facial movement is symmetric. Decrease eyebrow elevation VIII: hearing is intact to voice XI: Shoulder shrug is symmetric. XII: tongue is midline without atrophy or  fasciculations. Slow to stick out tongue  Motor: decreased effort thorughout, at Least 4-5/5 bilateral UE, 4-5/5 bilateral lower extremitiy Sensory: Sensation is grossly intact bilateral UEs & LEs  No cogwheel rigidity on Left No to trace cogwheel rigidity on Right   Psychiatric:     Comments: Flat affect, slow to respond to questions, answer appropriate    Assessment & Plan:   See Encounters Tab for problem based charting.  Patient discussed with Dr. Daryll Drown

## 2018-12-27 NOTE — Patient Instructions (Addendum)
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

## 2018-12-28 ENCOUNTER — Encounter: Payer: Self-pay | Admitting: Internal Medicine

## 2018-12-28 DIAGNOSIS — F410 Panic disorder [episodic paroxysmal anxiety] without agoraphobia: Secondary | ICD-10-CM | POA: Insufficient documentation

## 2018-12-30 NOTE — Progress Notes (Signed)
Internal Medicine Clinic Attending  Case discussed with Dr. Melvin  at the time of the visit.  We reviewed the resident's history and exam and pertinent patient test results.  I agree with the assessment, diagnosis, and plan of care documented in the resident's note.  

## 2019-01-09 ENCOUNTER — Other Ambulatory Visit: Payer: Self-pay

## 2019-01-09 ENCOUNTER — Emergency Department (HOSPITAL_COMMUNITY)
Admission: EM | Admit: 2019-01-09 | Discharge: 2019-01-10 | Disposition: A | Discharge status: Home / Self Care | Payer: Medicaid Other | Attending: Emergency Medicine | Admitting: Emergency Medicine

## 2019-01-09 DIAGNOSIS — R531 Weakness: Secondary | ICD-10-CM | POA: Insufficient documentation

## 2019-01-09 DIAGNOSIS — Z79899 Other long term (current) drug therapy: Secondary | ICD-10-CM | POA: Insufficient documentation

## 2019-01-09 DIAGNOSIS — F17210 Nicotine dependence, cigarettes, uncomplicated: Secondary | ICD-10-CM | POA: Insufficient documentation

## 2019-01-09 DIAGNOSIS — G20 Parkinson's disease: Secondary | ICD-10-CM | POA: Insufficient documentation

## 2019-01-09 LAB — BASIC METABOLIC PANEL
Anion gap: 14 (ref 5–15)
BUN: 12 mg/dL (ref 6–20)
CO2: 24 mmol/L (ref 22–32)
Calcium: 9.9 mg/dL (ref 8.9–10.3)
Chloride: 103 mmol/L (ref 98–111)
Creatinine, Ser: 1.19 mg/dL (ref 0.61–1.24)
GFR calc Af Amer: >60 mL/min (ref >60)
GFR calc non Af Amer: >60 mL/min (ref >60)
Glucose, Bld: 102 mg/dL — ABNORMAL HIGH (ref 70–99)
Potassium: 3.8 mmol/L (ref 3.5–5.1)
Sodium: 141 mmol/L (ref 135–145)

## 2019-01-09 LAB — TROPONIN I: Troponin I: <0.03 ng/mL (ref <0.03)

## 2019-01-09 LAB — CBC
HCT: 49.3 % (ref 39.0–52.0)
Hemoglobin: 16.6 g/dL (ref 13.0–17.0)
MCH: 29.4 pg (ref 26.0–34.0)
MCHC: 33.7 g/dL (ref 30.0–36.0)
MCV: 87.3 fL (ref 80.0–100.0)
Platelets: 289 10*3/uL (ref 150–400)
RBC: 5.65 MIL/uL (ref 4.22–5.81)
RDW: 12.9 % (ref 11.5–15.5)
WBC: 13.5 10*3/uL — ABNORMAL HIGH (ref 4.0–10.5)
nRBC: 0 % (ref 0.0–0.2)

## 2019-01-09 LAB — CBG MONITORING, ED: Glucose-Capillary: 94 mg/dL (ref 70–99)

## 2019-01-09 LAB — ETHANOL: Alcohol, Ethyl (B): <10 mg/dL (ref <10)

## 2019-01-09 MED ORDER — SODIUM CHLORIDE 0.9% FLUSH
3.0000 mL | Freq: Once | INTRAVENOUS | Status: AC
  Administered 2019-01-09: 3 mL via INTRAVENOUS

## 2019-01-09 MED ORDER — SODIUM CHLORIDE 0.9 % IV BOLUS
1000.0000 mL | Freq: Once | INTRAVENOUS | Status: AC
  Administered 2019-01-09: 1000 mL via INTRAVENOUS

## 2019-01-09 NOTE — ED Triage Notes (Signed)
Pt arriving via [facility] for generalized weakness. Pt reports he had been walking for about an hour and felt weak so he sat down on the ground. Pt A&O x4.

## 2019-01-09 NOTE — ED Provider Notes (Signed)
[facility] DEPT Provider Note   CSN: [id number] Arrival date & time: 01/09/19  2211     History   Chief Complaint Chief Complaint  Patient presents with  . Weakness    HPI Lucas Reynolds is a 33 y.o. male.     Patient with a history of depression presents with sudden onset fatigue and generalized weakness while folding his clothing at a laudromat this afternoon. No pain, syncope, SOB, cough, fever. No prodromal illness or symptoms. No vomiting. He reports he walked to the laundromat in the heat and this may be contributing.   The history is provided by the patient. No language interpreter was used.    Past Medical History:  Diagnosis Date  . Dental abscess   . Depression   . Facial cellulitis    2/2 dental abscess (tooth #7)  . Low back pain     Patient Active Problem List   Diagnosis Date Noted  . Panic attacks 12/28/2018  . 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 Depression 12/27/2018  . Sialorrhea 12/27/2018  . 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      Home Medications    Prior to Admission medications   Medication Sig Start Date End Date Taking? Authorizing Provider  benztropine (COGENTIN) 1 MG tablet Take 1 mg by mouth at bedtime.   Yes [provider]  cholecalciferol (VITAMIN D3) 25 MCG (1000 UT) tablet Take 1,000 Units by mouth at bedtime.   Yes [provider]  citalopram (CELEXA) 20 MG tablet Take 20 mg by mouth at bedtime.   Yes [provider]  haloperidol lactate (HALDOL) 5 MG/ML injection 150 mg every 30 (thirty) days.   Yes [provider]  OLANZapine (ZYPREXA) 20 MG tablet Take 20 mg by mouth at bedtime.   Yes [provider]  Valbenazine Tosylate 80 MG CAPS Take 80 mg by mouth daily.   Yes [provider]  ibuprofen (ADVIL,MOTRIN) 600 MG tablet Take 1 tablet (600 mg total) by mouth every 8 (eight) hours as needed for  moderate pain. Patient not taking: Reported on 01/09/2019 07/15/18   Duffy Bruce, MD  scopolamine (TRANSDERM-SCOP) 1 MG/3DAYS Place 1 patch (1.5 mg total) onto the skin every 3 (three) days. For thick secretions Patient not taking: Reported on 01/09/2019 12/27/18   Neva Seat, MD    Family History No family history on file.  Social History Social History   Tobacco Use  . Smoking status: Current Every Day Smoker    Packs/day: 0.50    Types: Cigarettes  . Smokeless tobacco: Never Used  Substance Use Topics  . Alcohol use: Not Currently  . Drug use: No     Allergies   Patient has no known allergies.   Review of Systems Review of Systems  Constitutional: Positive for fatigue. Negative for chills, diaphoresis and fever.  HENT: Negative.   Respiratory: Negative.  Negative for cough and shortness of breath.   Cardiovascular: Negative.  Negative for chest pain.  Gastrointestinal: Negative.  Negative for nausea and vomiting.  Musculoskeletal: Negative.   Skin: Negative.   Neurological: Positive for weakness. Negative for headaches.     Physical Exam Updated Vital Signs BP 112/74 (BP Location: Left Arm)   Pulse 99   Temp 98.7 F (37.1 C) (Oral)   Resp (!) 25   Ht 5\' 9"  (1.753 m)   Wt 72.6 kg   SpO2 95%   BMI 23.63 kg/m  Physical Exam Vitals signs and nursing note reviewed.  Constitutional:      Appearance: He is well-developed.  HENT:     Head: Normocephalic.     Mouth/Throat:     Mouth: Mucous membranes are dry.  Neck:     Musculoskeletal: Normal range of motion and neck supple.  Cardiovascular:     Rate and Rhythm: Regular rhythm. Tachycardia present.  Pulmonary:     Effort: Pulmonary effort is normal.     Breath sounds: Normal breath sounds.  Abdominal:     General: Bowel sounds are normal.     Palpations: Abdomen is soft.     Tenderness: There is no abdominal tenderness. There is no guarding or rebound.  Musculoskeletal: Normal range of  motion.  Skin:    General: Skin is warm and dry.     Findings: No rash.  Neurological:     General: No focal deficit present.     Mental Status: He is alert and oriented to person, place, and time.     Comments: Generalized extremity tremor   Psychiatric:        Mood and Affect: Affect is blunt and flat.        Speech: Speech is delayed.        Behavior: Behavior is withdrawn.        Thought Content: Thought content does not include homicidal or suicidal ideation.      ED Treatments / Results  Labs (all labs ordered are listed, but only abnormal results are displayed) Labs Reviewed  BASIC METABOLIC PANEL - Abnormal; Notable for the following components:      Result Value   Glucose, Bld 102 (*)    All other components within normal limits  CBC - Abnormal; Notable for the following components:   WBC 13.5 (*)    All other components within normal limits  TROPONIN I  ETHANOL  URINALYSIS, ROUTINE W REFLEX MICROSCOPIC  RAPID URINE DRUG SCREEN, HOSP PERFORMED  CK  CBG MONITORING, ED    EKG EKG Interpretation  Date/Time:  Monday January 09 2019 23:04:29 EDT Ventricular Rate:  104 PR Interval:    QRS Duration: 95 QT Interval:  317 QTC Calculation: 417 R Axis:   -102 Text Interpretation:  Sinus tachycardia Probable left atrial enlargement Consider right ventricular hypertrophy No significant change since last tracing Confirmed by Rochele RaringWard, Kristen [phone]([id number]) on 01/09/2019 11:18:01 PM   Radiology No results found.  Procedures Procedures (including critical care time)  Medications Ordered in ED Medications  sodium chloride flush (NS) 0.9 % injection 3 mL (3 mLs Intravenous Given 01/09/19 2312)  sodium chloride 0.9 % bolus 1,000 mL (1,000 mLs Intravenous New Bag/Given 01/09/19 2339)     Initial Impression / Assessment and Plan / ED Course  I have reviewed the triage vital signs and the nursing notes.  Pertinent labs & imaging results that were available during my care of the  patient were reviewed by me and considered in my medical decision making (see chart for details).       Patient to ED with onset significant fatigue and generalized weakness while folding clothes in a laundromat today. No pain, SOB, fever, cough, SOB. He reports getting overheated may have contributed to symptoms.   The patient has an odd affect - blunt, slow to verbalize responses. Chart reviewed and this has been documented in the past on PE. No record of psychiatric admissions. He denies current psychiatric issue tonight.   He has an elevated CK  of 760, mild leukocytosis. Will aggressively hydrate and observe.   2:45 - patient has had 2 liters of IVFs. He reports feeling better. Still has not urinated. Will provide 3rd liter and continue to observe.  5:00 - patient continues to feel improved. He reports tremor seen on exam is normal for him and not a new occurrence. He is offered food and additional PO fluids which he is taking. He ambulates without assistance and gait is steady.   VSS improved. He is felt appropriate for discharge home.   Final Clinical Impressions(s) / ED Diagnoses   Final diagnoses:  None   1. Weakness  ED Discharge Orders    None       Elpidio AnisUpstill, Delwyn Scoggin, Cordelia Poche-C 01/10/19 [id number]    Ward, Layla MawKristen N, DO 01/10/19 [phone]

## 2019-01-10 LAB — CK: Total CK: 760 U/L — ABNORMAL HIGH (ref 49–397)

## 2019-01-10 MED ORDER — SODIUM CHLORIDE 0.9 % IV BOLUS
1000.0000 mL | Freq: Once | INTRAVENOUS | Status: AC
  Administered 2019-01-10: 1000 mL via INTRAVENOUS

## 2019-01-10 NOTE — ED Notes (Signed)
Patient attempted to give urine sample but was unable to void. Urinal at bedside and patient encouraged to void.

## 2019-01-10 NOTE — Discharge Instructions (Addendum)
Stay hydrated while walking or exposed to heat. Return here with any recurrent weakness or new concerns.   You have been given a list of primary care offices as a resource to finding a primary care doctor.

## 2019-01-10 NOTE — ED Notes (Signed)
Patient ambulated up from bed and down the hall without assistance.

## 2019-09-17 IMAGING — CT CT ANGIO CHEST
2 of 6 series · 18 of 36 positions shown · IV contrast (iopamidol)
Comparison: Chest radiograph performed 07/14/2018

CLINICAL DATA: Acute onset of central to left-sided chest pain,
radiating to the left shoulder.

EXAM:
CT ANGIOGRAPHY CHEST WITH CONTRAST
TECHNIQUE: Multidetector CT imaging of the chest was performed using the
standard protocol during bolus administration of intravenous
contrast. Multiplanar CT image reconstructions and MIPs were
obtained to evaluate the vascular anatomy.
CONTRAST:  48mL [id number] IOPAMIDOL ([id number]) INJECTION 76%

[Series 7: pe thins · axial · 0.65mm/px · z∈[+1137,+1377]mm · 17 of 383 slices shown]
[im 20/383  lung]
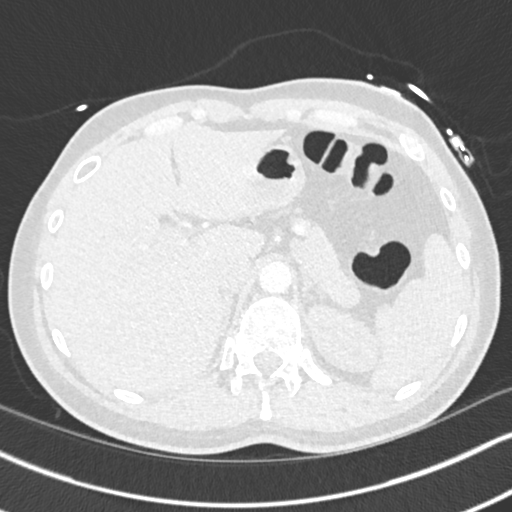
[im 39/383  mediastinal]
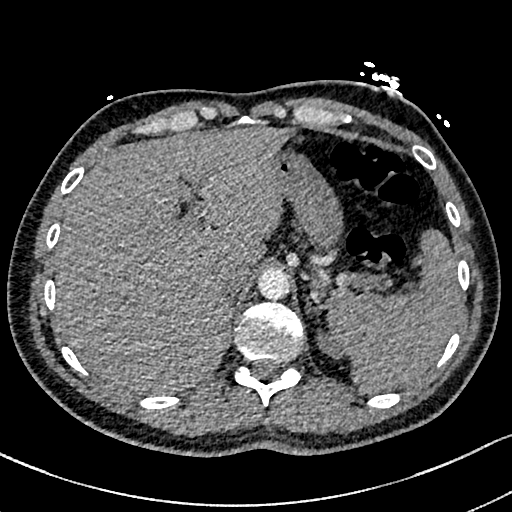
[im 58/383  lung]
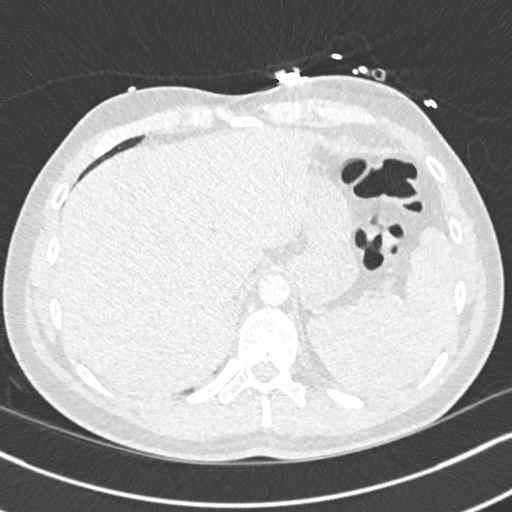
[im 77/383  mediastinal]
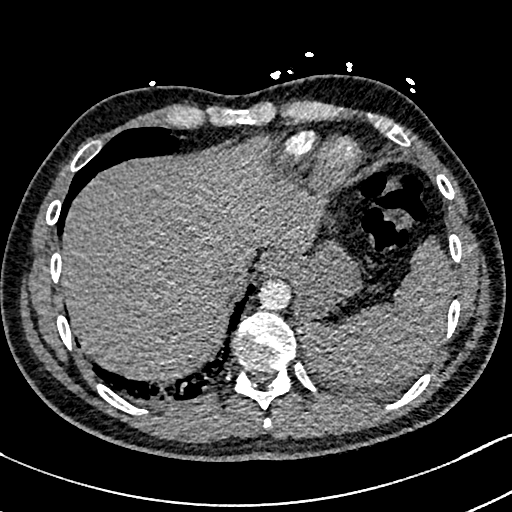
[im 115/383  lung]
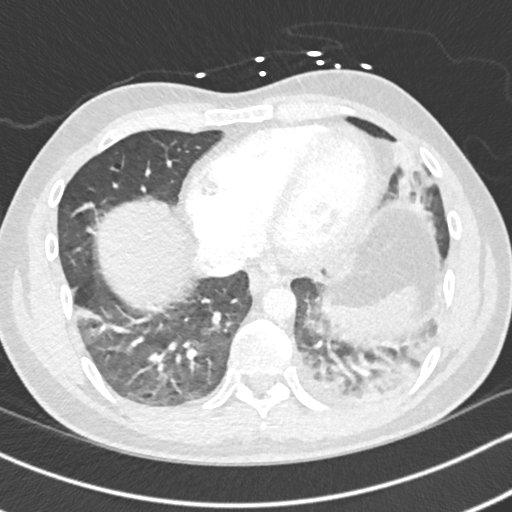
[im 134/383  mediastinal]
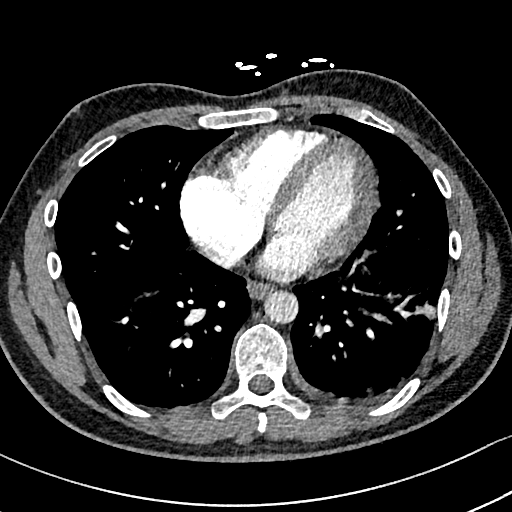
[im 153/383  lung]
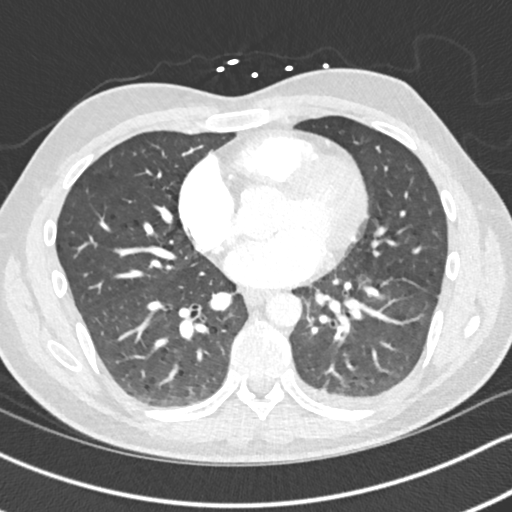
[im 172/383  mediastinal]
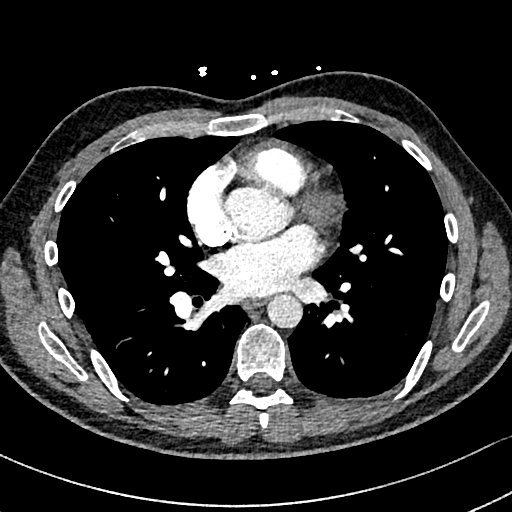
[im 192/383  lung]
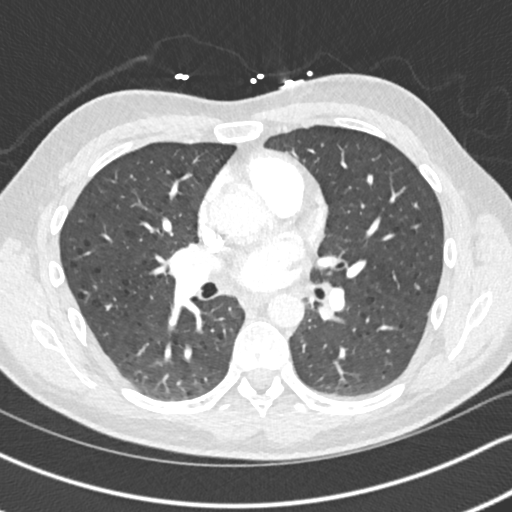
[im 211/383  mediastinal]
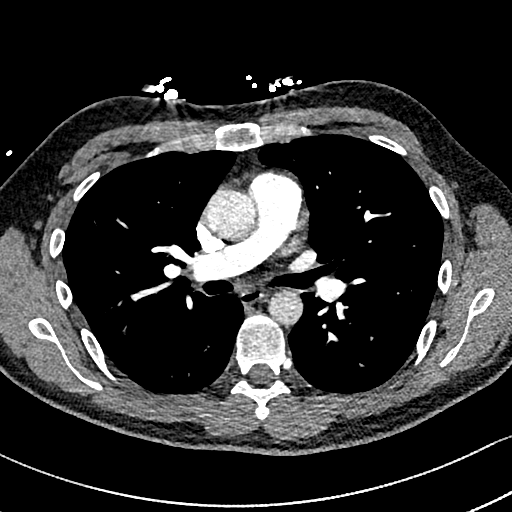
[im 230/383  lung]
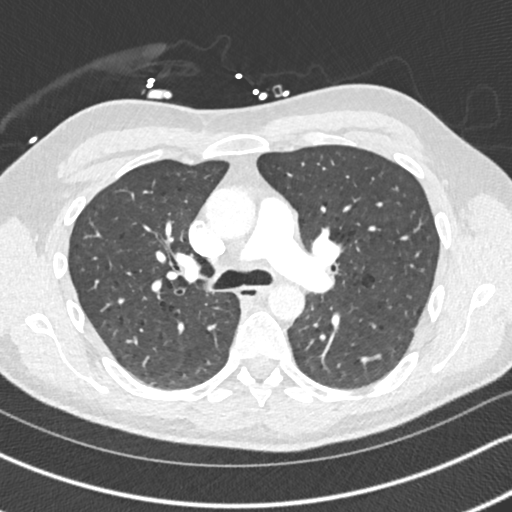
[im 249/383  mediastinal]
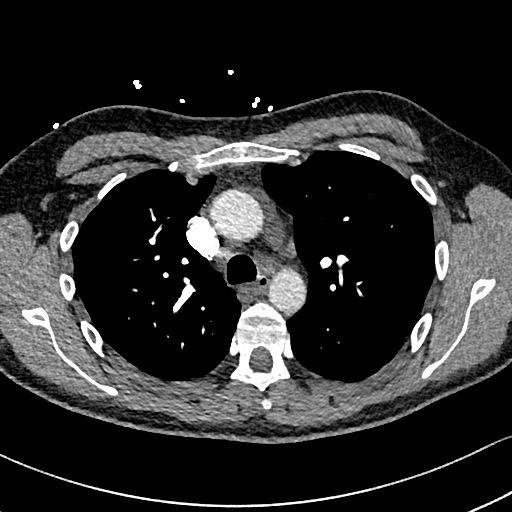
[im 268/383  lung]
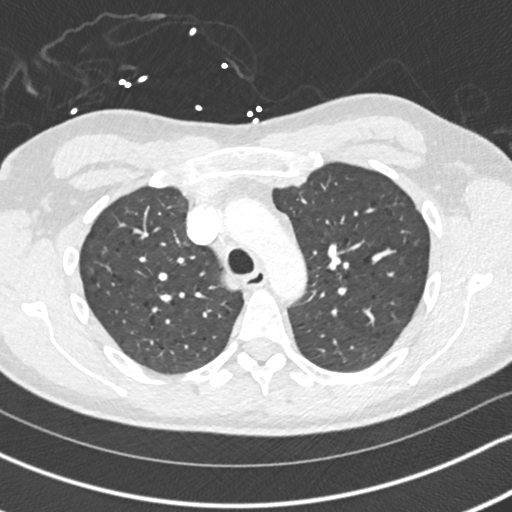
[im 306/383  mediastinal]
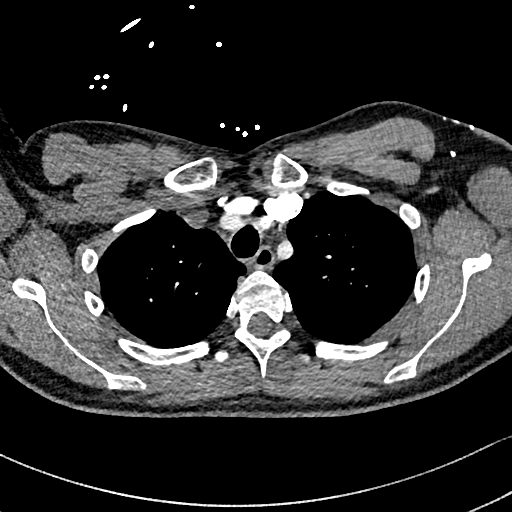
[im 325/383  lung]
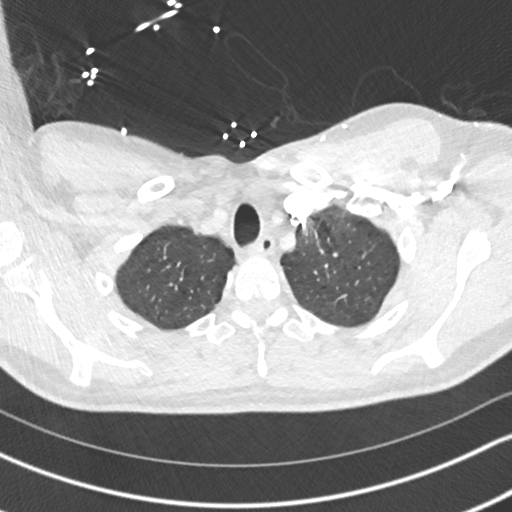
[im 344/383  mediastinal]
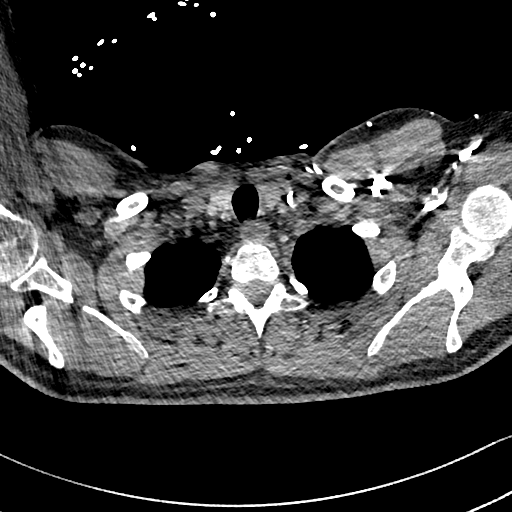
[im 363/383  lung]
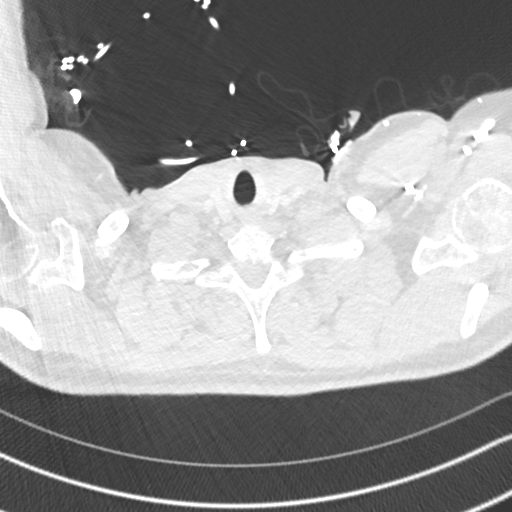

[Series 8: pe 2mm cor · coronal · 0.53mm/px · 1 of 110 slices shown]
[im 55/110  mediastinal]
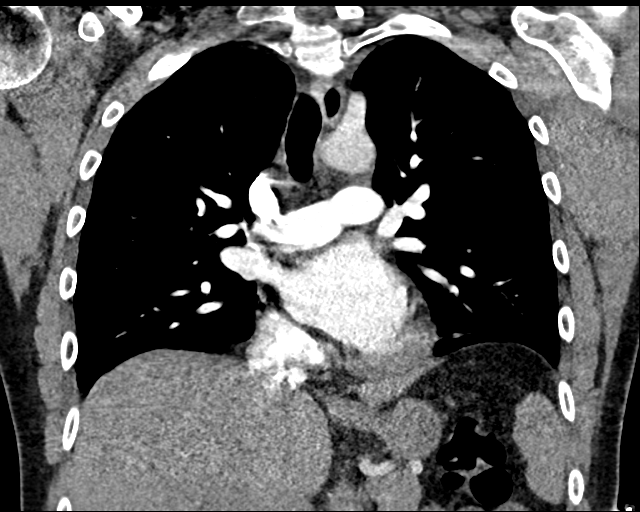

[18 of 36 positions shown; findings below may reference images not displayed]

FINDINGS: Cardiovascular:  There is no evidence of pulmonary embolus.

The heart is normal in size. The thoracic aorta is grossly
unremarkable. The great vessels are within normal limits.

Mediastinum/Nodes: The mediastinum is unremarkable in appearance. No
mediastinal lymphadenopathy is seen. No pericardial effusion is
identified. The thyroid gland is unremarkable. No axillary
lymphadenopathy is appreciated.

Lungs/Pleura: Mild bibasilar airspace opacities raise concern for
pneumonia. Trace left-sided pleural fluid is noted. Scattered
bilateral emphysema is noted. No pneumothorax is identified.

Upper Abdomen: The visualized portions of the liver and spleen are
unremarkable. The visualized portions of the pancreas, adrenal
glands and left kidney are within normal limits.

Musculoskeletal: No acute osseous abnormalities are identified. The
visualized musculature is unremarkable in appearance.

Review of the MIP images confirms the above findings.
IMPRESSION: 1. No evidence of pulmonary embolus.
2. Mild bibasilar airspace opacities raise concern for pneumonia.
Trace left-sided pleural fluid noted.
3. Scattered bilateral emphysema noted.
